# Patient Record
Sex: Female | Born: 1970
Health system: Southern US, Community
[De-identification: ages and names within clinical notes are randomized; demographics above are authoritative.]

## PROBLEM LIST (undated history)

## (undated) DIAGNOSIS — N870 Mild cervical dysplasia: Secondary | ICD-10-CM

## (undated) DIAGNOSIS — D259 Leiomyoma of uterus, unspecified: Secondary | ICD-10-CM

## (undated) DIAGNOSIS — A749 Chlamydial infection, unspecified: Secondary | ICD-10-CM

## (undated) DIAGNOSIS — Z87898 Personal history of other specified conditions: Secondary | ICD-10-CM

## (undated) DIAGNOSIS — Z87440 Personal history of urinary (tract) infections: Secondary | ICD-10-CM

## (undated) DIAGNOSIS — B9689 Other specified bacterial agents as the cause of diseases classified elsewhere: Secondary | ICD-10-CM

## (undated) DIAGNOSIS — D649 Anemia, unspecified: Secondary | ICD-10-CM

## (undated) DIAGNOSIS — B379 Candidiasis, unspecified: Secondary | ICD-10-CM

## (undated) DIAGNOSIS — I10 Essential (primary) hypertension: Secondary | ICD-10-CM

## (undated) DIAGNOSIS — N760 Acute vaginitis: Secondary | ICD-10-CM

## (undated) DIAGNOSIS — Z8742 Personal history of other diseases of the female genital tract: Secondary | ICD-10-CM

## (undated) DIAGNOSIS — B373 Candidiasis of vulva and vagina: Secondary | ICD-10-CM

## (undated) DIAGNOSIS — Z8619 Personal history of other infectious and parasitic diseases: Secondary | ICD-10-CM

## (undated) DIAGNOSIS — M545 Low back pain: Secondary | ICD-10-CM

## (undated) HISTORY — DX: Personal history of other infectious and parasitic diseases: Z86.19

## (undated) HISTORY — DX: Personal history of other specified conditions: Z87.898

## (undated) HISTORY — DX: Candidiasis of vulva and vagina: B37.3

## (undated) HISTORY — DX: Personal history of urinary (tract) infections: Z87.440

## (undated) HISTORY — DX: Other specified bacterial agents as the cause of diseases classified elsewhere: B96.89

## (undated) HISTORY — PX: OTHER SURGICAL HISTORY: SHX169

## (undated) HISTORY — DX: Personal history of other diseases of the female genital tract: Z87.42

## (undated) HISTORY — DX: Chlamydial infection, unspecified: A74.9

## (undated) HISTORY — DX: Acute vaginitis: N76.0

## (undated) HISTORY — DX: Candidiasis, unspecified: B37.9

## (undated) HISTORY — DX: Low back pain: M54.5

## (undated) HISTORY — DX: Leiomyoma of uterus, unspecified: D25.9

## (undated) HISTORY — PX: TONSILLECTOMY: SUR1361

## (undated) HISTORY — DX: Mild cervical dysplasia: N87.0

---

## 1993-01-09 DIAGNOSIS — A749 Chlamydial infection, unspecified: Secondary | ICD-10-CM | POA: Insufficient documentation

## 1993-01-09 HISTORY — DX: Chlamydial infection, unspecified: A74.9

## 1998-05-11 DIAGNOSIS — B3731 Acute candidiasis of vulva and vagina: Secondary | ICD-10-CM

## 1998-05-11 DIAGNOSIS — B373 Candidiasis of vulva and vagina: Secondary | ICD-10-CM

## 1998-05-11 DIAGNOSIS — Z87440 Personal history of urinary (tract) infections: Secondary | ICD-10-CM

## 1998-05-11 HISTORY — DX: Candidiasis of vulva and vagina: B37.3

## 1998-05-11 HISTORY — DX: Acute candidiasis of vulva and vagina: B37.31

## 1998-05-11 HISTORY — DX: Personal history of urinary (tract) infections: Z87.440

## 1999-04-04 ENCOUNTER — Other Ambulatory Visit: Admission: RE | Admit: 1999-04-04 | Discharge: 1999-04-04 | Payer: Self-pay | Admitting: *Deleted

## 1999-12-21 ENCOUNTER — Other Ambulatory Visit: Admission: RE | Admit: 1999-12-21 | Discharge: 1999-12-21 | Payer: Self-pay | Admitting: *Deleted

## 1999-12-21 ENCOUNTER — Encounter (INDEPENDENT_AMBULATORY_CARE_PROVIDER_SITE_OTHER): Payer: Self-pay

## 2000-01-18 ENCOUNTER — Other Ambulatory Visit: Admission: RE | Admit: 2000-01-18 | Discharge: 2000-01-18 | Payer: Self-pay | Admitting: *Deleted

## 2000-01-18 ENCOUNTER — Encounter (INDEPENDENT_AMBULATORY_CARE_PROVIDER_SITE_OTHER): Payer: Self-pay

## 2000-08-14 ENCOUNTER — Other Ambulatory Visit: Admission: RE | Admit: 2000-08-14 | Discharge: 2000-08-14 | Payer: Self-pay | Admitting: Obstetrics and Gynecology

## 2001-09-09 ENCOUNTER — Other Ambulatory Visit: Admission: RE | Admit: 2001-09-09 | Discharge: 2001-09-09 | Payer: Self-pay | Admitting: Obstetrics and Gynecology

## 2002-10-04 ENCOUNTER — Other Ambulatory Visit: Admission: RE | Admit: 2002-10-04 | Discharge: 2002-10-04 | Payer: Self-pay | Admitting: Obstetrics and Gynecology

## 2003-10-14 ENCOUNTER — Other Ambulatory Visit: Admission: RE | Admit: 2003-10-14 | Discharge: 2003-10-14 | Payer: Self-pay | Admitting: Obstetrics and Gynecology

## 2004-05-22 ENCOUNTER — Other Ambulatory Visit: Admission: RE | Admit: 2004-05-22 | Discharge: 2004-05-22 | Payer: Self-pay | Admitting: Obstetrics and Gynecology

## 2004-11-20 ENCOUNTER — Other Ambulatory Visit: Admission: RE | Admit: 2004-11-20 | Discharge: 2004-11-20 | Payer: Self-pay | Admitting: Obstetrics and Gynecology

## 2005-11-20 ENCOUNTER — Other Ambulatory Visit: Admission: RE | Admit: 2005-11-20 | Discharge: 2005-11-20 | Payer: Self-pay | Admitting: Obstetrics and Gynecology

## 2007-07-27 ENCOUNTER — Encounter: Admission: RE | Admit: 2007-07-27 | Discharge: 2007-08-14 | Payer: Self-pay | Admitting: Family Medicine

## 2007-12-10 DIAGNOSIS — M545 Low back pain, unspecified: Secondary | ICD-10-CM | POA: Insufficient documentation

## 2007-12-10 HISTORY — DX: Low back pain, unspecified: M54.50

## 2008-11-11 DIAGNOSIS — D259 Leiomyoma of uterus, unspecified: Secondary | ICD-10-CM

## 2008-11-11 HISTORY — DX: Leiomyoma of uterus, unspecified: D25.9

## 2010-12-06 DIAGNOSIS — Z8742 Personal history of other diseases of the female genital tract: Secondary | ICD-10-CM | POA: Insufficient documentation

## 2010-12-06 DIAGNOSIS — B9689 Other specified bacterial agents as the cause of diseases classified elsewhere: Secondary | ICD-10-CM | POA: Insufficient documentation

## 2010-12-06 DIAGNOSIS — N760 Acute vaginitis: Secondary | ICD-10-CM | POA: Insufficient documentation

## 2010-12-06 HISTORY — DX: Other specified bacterial agents as the cause of diseases classified elsewhere: B96.89

## 2010-12-06 HISTORY — DX: Acute vaginitis: N76.0

## 2010-12-06 HISTORY — DX: Personal history of other diseases of the female genital tract: Z87.42

## 2011-11-12 DIAGNOSIS — Z8742 Personal history of other diseases of the female genital tract: Secondary | ICD-10-CM

## 2011-11-12 HISTORY — DX: Personal history of other diseases of the female genital tract: Z87.42

## 2012-01-02 DIAGNOSIS — Z87898 Personal history of other specified conditions: Secondary | ICD-10-CM | POA: Insufficient documentation

## 2012-01-02 HISTORY — DX: Personal history of other specified conditions: Z87.898

## 2012-01-17 ENCOUNTER — Encounter (INDEPENDENT_AMBULATORY_CARE_PROVIDER_SITE_OTHER): Payer: 59 | Admitting: Obstetrics and Gynecology

## 2012-01-17 ENCOUNTER — Other Ambulatory Visit (INDEPENDENT_AMBULATORY_CARE_PROVIDER_SITE_OTHER): Payer: 59

## 2012-01-17 DIAGNOSIS — N83209 Unspecified ovarian cyst, unspecified side: Secondary | ICD-10-CM

## 2012-01-17 DIAGNOSIS — D259 Leiomyoma of uterus, unspecified: Secondary | ICD-10-CM

## 2012-02-10 DIAGNOSIS — N920 Excessive and frequent menstruation with regular cycle: Secondary | ICD-10-CM | POA: Insufficient documentation

## 2012-02-10 DIAGNOSIS — D219 Benign neoplasm of connective and other soft tissue, unspecified: Secondary | ICD-10-CM | POA: Insufficient documentation

## 2012-02-10 DIAGNOSIS — N83209 Unspecified ovarian cyst, unspecified side: Secondary | ICD-10-CM | POA: Insufficient documentation

## 2012-02-10 DIAGNOSIS — Z8742 Personal history of other diseases of the female genital tract: Secondary | ICD-10-CM | POA: Insufficient documentation

## 2012-02-10 DIAGNOSIS — R350 Frequency of micturition: Secondary | ICD-10-CM | POA: Insufficient documentation

## 2012-02-12 ENCOUNTER — Other Ambulatory Visit: Payer: Self-pay

## 2012-02-12 DIAGNOSIS — N83209 Unspecified ovarian cyst, unspecified side: Secondary | ICD-10-CM

## 2012-02-28 ENCOUNTER — Encounter: Payer: 59 | Admitting: Obstetrics and Gynecology

## 2012-02-28 ENCOUNTER — Other Ambulatory Visit: Payer: 59

## 2012-03-09 ENCOUNTER — Encounter: Payer: Self-pay | Admitting: Obstetrics and Gynecology

## 2012-03-09 ENCOUNTER — Ambulatory Visit (INDEPENDENT_AMBULATORY_CARE_PROVIDER_SITE_OTHER): Payer: 59

## 2012-03-09 ENCOUNTER — Other Ambulatory Visit: Payer: Self-pay | Admitting: Obstetrics and Gynecology

## 2012-03-09 ENCOUNTER — Ambulatory Visit (INDEPENDENT_AMBULATORY_CARE_PROVIDER_SITE_OTHER): Payer: 59 | Admitting: Obstetrics and Gynecology

## 2012-03-09 VITALS — BP 120/90 | Temp 99.0°F | Wt 221.0 lb

## 2012-03-09 DIAGNOSIS — N83209 Unspecified ovarian cyst, unspecified side: Secondary | ICD-10-CM

## 2012-03-09 DIAGNOSIS — M545 Low back pain, unspecified: Secondary | ICD-10-CM

## 2012-03-09 DIAGNOSIS — B49 Unspecified mycosis: Secondary | ICD-10-CM

## 2012-03-09 DIAGNOSIS — B9689 Other specified bacterial agents as the cause of diseases classified elsewhere: Secondary | ICD-10-CM

## 2012-03-09 DIAGNOSIS — A499 Bacterial infection, unspecified: Secondary | ICD-10-CM

## 2012-03-09 DIAGNOSIS — D259 Leiomyoma of uterus, unspecified: Secondary | ICD-10-CM

## 2012-03-09 DIAGNOSIS — Z87440 Personal history of urinary (tract) infections: Secondary | ICD-10-CM

## 2012-03-09 DIAGNOSIS — A749 Chlamydial infection, unspecified: Secondary | ICD-10-CM

## 2012-03-09 DIAGNOSIS — A7489 Other chlamydial diseases: Secondary | ICD-10-CM

## 2012-03-09 DIAGNOSIS — Z8619 Personal history of other infectious and parasitic diseases: Secondary | ICD-10-CM | POA: Insufficient documentation

## 2012-03-09 DIAGNOSIS — Z87898 Personal history of other specified conditions: Secondary | ICD-10-CM

## 2012-03-09 DIAGNOSIS — N760 Acute vaginitis: Secondary | ICD-10-CM

## 2012-03-09 DIAGNOSIS — Z87448 Personal history of other diseases of urinary system: Secondary | ICD-10-CM

## 2012-03-09 DIAGNOSIS — Z8742 Personal history of other diseases of the female genital tract: Secondary | ICD-10-CM

## 2012-03-09 DIAGNOSIS — N870 Mild cervical dysplasia: Secondary | ICD-10-CM | POA: Insufficient documentation

## 2012-03-09 DIAGNOSIS — B379 Candidiasis, unspecified: Secondary | ICD-10-CM

## 2012-03-09 DIAGNOSIS — N926 Irregular menstruation, unspecified: Secondary | ICD-10-CM

## 2012-03-09 NOTE — Progress Notes (Signed)
Addended byWinfred Leeds on: 03/09/2012 01:07 PM   Modules accepted: Orders

## 2012-03-09 NOTE — Patient Instructions (Signed)
Give patient a note for work. 

## 2012-03-09 NOTE — Progress Notes (Signed)
Patient seen 01/18/12 with right ovarian cyst 3.9 cm returns for f/u ultrasound.  Patient had two episodes of  bleeding in March but LMP was normal. (flow 5-7 day with pad change hourly-2 hours)   O: Right  ovarian cyst resolved, fibroids unchanged  UPT: negative  A:  Resolved right ovarian cyst  P: Menstrual calendar     Annual exam in 1 year 12/2012

## 2012-06-22 ENCOUNTER — Telehealth: Payer: Self-pay | Admitting: Obstetrics and Gynecology

## 2012-06-22 NOTE — Telephone Encounter (Signed)
Triage/gen.quest °

## 2012-06-22 NOTE — Telephone Encounter (Signed)
Spoke with pt Alice Flores msg pt going on a trip in October wants rx to delay cycle pt states was given something once before advised pt will consult with provider and call her back pt voice understanding

## 2012-06-25 ENCOUNTER — Telehealth: Payer: Self-pay | Admitting: Obstetrics and Gynecology

## 2012-06-25 NOTE — Telephone Encounter (Signed)
LINDER/EP PT

## 2012-06-25 NOTE — Telephone Encounter (Signed)
Call to patien, t with a history of menorrhagia and fibroids, to ask about the specific medication she was requesting.  After a review of her chart, she was given, in January of 2012 Provera 10 mg to take to decrease her flow while on vacation however, no other regimen relating to altering her period was seen in her chart. Left voicemail message for patient stating this and asked her to return call with more details.  If patient is referring to the Provera, she may be given Provera 10 mg # 30 1 po every 6 hours until her bleeding stops then 1 po daily x 14 days no refill , for her to take for heavy bleeding if needed, while on her trip.    Braylyn Eye, PA-C

## 2012-06-25 NOTE — Telephone Encounter (Signed)
Patient requesting medication to delay her menses because of a trip she will be going on in October.  States that she has been given "something" before and would like that same medication.   Patient's chart is needed to further manage this patient.  Amilyah Nack, PA-C

## 2012-06-25 NOTE — Telephone Encounter (Signed)
Chart on your desk.

## 2012-06-26 ENCOUNTER — Telehealth: Payer: Self-pay

## 2012-06-26 ENCOUNTER — Other Ambulatory Visit: Payer: Self-pay

## 2012-06-26 MED ORDER — MEDROXYPROGESTERONE ACETATE 10 MG PO TABS: ORAL_TABLET | ORAL | Status: DC

## 2012-06-26 NOTE — Telephone Encounter (Signed)
TC TO PT REGARDING RX REQUEST. INFORMED PT THAT PER EP WE WILL SEND TO PT PHARMACY PROVERA 10 MG #30 1PO EVERY 6 HOURS UNTIL BLEEDING STOPS THEN 1PO DAILY X 14 DAYS WITH NO REFILLS. PT VOICED UNDERSTANDING.

## 2012-06-26 NOTE — Telephone Encounter (Signed)
done

## 2013-01-27 ENCOUNTER — Other Ambulatory Visit: Payer: Self-pay | Admitting: Obstetrics and Gynecology

## 2013-01-27 ENCOUNTER — Other Ambulatory Visit: Payer: Self-pay

## 2013-01-27 DIAGNOSIS — Z1231 Encounter for screening mammogram for malignant neoplasm of breast: Secondary | ICD-10-CM

## 2013-01-28 LAB — HIV ANTIBODY (ROUTINE TESTING W REFLEX): HIV: NONREACTIVE

## 2013-01-28 LAB — HSV(HERPES SIMPLEX VRS) I + II AB-IGG: HSV 2 Glycoprotein G Ab, IgG: 0.12 IV

## 2013-01-29 LAB — PAP IG, CT-NG, RFX HPV ASCU

## 2013-02-22 ENCOUNTER — Ambulatory Visit
Admission: RE | Admit: 2013-02-22 | Discharge: 2013-02-22 | Disposition: A | Discharge status: Home / Self Care | Payer: 59 | Source: Ambulatory Visit

## 2013-02-22 DIAGNOSIS — Z1231 Encounter for screening mammogram for malignant neoplasm of breast: Secondary | ICD-10-CM

## 2013-02-23 ENCOUNTER — Other Ambulatory Visit: Payer: Self-pay

## 2013-02-23 DIAGNOSIS — R928 Other abnormal and inconclusive findings on diagnostic imaging of breast: Secondary | ICD-10-CM

## 2013-03-09 ENCOUNTER — Ambulatory Visit
Admission: RE | Admit: 2013-03-09 | Discharge: 2013-03-09 | Disposition: A | Discharge status: Home / Self Care | Payer: 59 | Source: Ambulatory Visit

## 2013-03-09 DIAGNOSIS — R928 Other abnormal and inconclusive findings on diagnostic imaging of breast: Secondary | ICD-10-CM

## 2014-02-03 ENCOUNTER — Other Ambulatory Visit: Payer: Self-pay

## 2014-02-03 DIAGNOSIS — Z1231 Encounter for screening mammogram for malignant neoplasm of breast: Secondary | ICD-10-CM

## 2014-02-23 ENCOUNTER — Ambulatory Visit: Payer: 59

## 2014-02-25 ENCOUNTER — Ambulatory Visit
Admission: RE | Admit: 2014-02-25 | Discharge: 2014-02-25 | Disposition: A | Discharge status: Home / Self Care | Payer: 59 | Source: Ambulatory Visit

## 2014-02-25 DIAGNOSIS — Z1231 Encounter for screening mammogram for malignant neoplasm of breast: Secondary | ICD-10-CM

## 2015-01-26 ENCOUNTER — Other Ambulatory Visit: Payer: Self-pay

## 2015-01-26 DIAGNOSIS — Z1239 Encounter for other screening for malignant neoplasm of breast: Secondary | ICD-10-CM

## 2015-03-03 ENCOUNTER — Ambulatory Visit
Admission: RE | Admit: 2015-03-03 | Discharge: 2015-03-03 | Disposition: A | Discharge status: Home / Self Care | Payer: 59 | Source: Ambulatory Visit

## 2015-03-03 DIAGNOSIS — Z1239 Encounter for other screening for malignant neoplasm of breast: Secondary | ICD-10-CM

## 2016-02-02 ENCOUNTER — Other Ambulatory Visit: Payer: Self-pay

## 2016-02-02 DIAGNOSIS — Z1231 Encounter for screening mammogram for malignant neoplasm of breast: Secondary | ICD-10-CM

## 2016-03-04 ENCOUNTER — Ambulatory Visit
Admission: RE | Admit: 2016-03-04 | Discharge: 2016-03-04 | Disposition: A | Discharge status: Home / Self Care | Payer: 59 | Source: Ambulatory Visit

## 2016-03-04 DIAGNOSIS — Z1231 Encounter for screening mammogram for malignant neoplasm of breast: Secondary | ICD-10-CM

## 2016-03-06 ENCOUNTER — Other Ambulatory Visit: Payer: Self-pay | Admitting: Obstetrics and Gynecology

## 2016-03-06 DIAGNOSIS — R928 Other abnormal and inconclusive findings on diagnostic imaging of breast: Secondary | ICD-10-CM

## 2016-03-13 ENCOUNTER — Ambulatory Visit
Admission: RE | Admit: 2016-03-13 | Discharge: 2016-03-13 | Disposition: A | Discharge status: Home / Self Care | Payer: 59 | Source: Ambulatory Visit | Attending: Obstetrics and Gynecology | Admitting: Obstetrics and Gynecology

## 2016-03-13 DIAGNOSIS — R928 Other abnormal and inconclusive findings on diagnostic imaging of breast: Secondary | ICD-10-CM

## 2017-02-13 ENCOUNTER — Other Ambulatory Visit: Payer: Self-pay | Admitting: Obstetrics and Gynecology

## 2017-02-13 DIAGNOSIS — Z1231 Encounter for screening mammogram for malignant neoplasm of breast: Secondary | ICD-10-CM

## 2017-02-17 DIAGNOSIS — Z0000 Encounter for general adult medical examination without abnormal findings: Secondary | ICD-10-CM | POA: Diagnosis not present

## 2017-02-21 DIAGNOSIS — I10 Essential (primary) hypertension: Secondary | ICD-10-CM | POA: Diagnosis not present

## 2017-02-21 DIAGNOSIS — R7301 Impaired fasting glucose: Secondary | ICD-10-CM | POA: Diagnosis not present

## 2017-02-21 DIAGNOSIS — D649 Anemia, unspecified: Secondary | ICD-10-CM | POA: Diagnosis not present

## 2017-02-21 DIAGNOSIS — Z0000 Encounter for general adult medical examination without abnormal findings: Secondary | ICD-10-CM | POA: Diagnosis not present

## 2017-02-21 DIAGNOSIS — Z13220 Encounter for screening for lipoid disorders: Secondary | ICD-10-CM | POA: Diagnosis not present

## 2017-03-05 ENCOUNTER — Ambulatory Visit
Admission: RE | Admit: 2017-03-05 | Discharge: 2017-03-05 | Disposition: A | Discharge status: Home / Self Care | Payer: 59 | Source: Ambulatory Visit | Attending: Obstetrics and Gynecology | Admitting: Obstetrics and Gynecology

## 2017-03-05 DIAGNOSIS — Z1231 Encounter for screening mammogram for malignant neoplasm of breast: Secondary | ICD-10-CM

## 2017-03-26 DIAGNOSIS — N920 Excessive and frequent menstruation with regular cycle: Secondary | ICD-10-CM | POA: Diagnosis not present

## 2017-03-26 DIAGNOSIS — Z01419 Encounter for gynecological examination (general) (routine) without abnormal findings: Secondary | ICD-10-CM | POA: Diagnosis not present

## 2017-03-28 DIAGNOSIS — D649 Anemia, unspecified: Secondary | ICD-10-CM | POA: Diagnosis not present

## 2017-04-01 DIAGNOSIS — N920 Excessive and frequent menstruation with regular cycle: Secondary | ICD-10-CM | POA: Diagnosis not present

## 2017-04-01 DIAGNOSIS — N946 Dysmenorrhea, unspecified: Secondary | ICD-10-CM | POA: Diagnosis not present

## 2017-04-01 DIAGNOSIS — D649 Anemia, unspecified: Secondary | ICD-10-CM | POA: Diagnosis not present

## 2017-04-11 ENCOUNTER — Other Ambulatory Visit: Payer: Self-pay | Admitting: Obstetrics and Gynecology

## 2017-04-11 DIAGNOSIS — D250 Submucous leiomyoma of uterus: Secondary | ICD-10-CM

## 2017-04-18 ENCOUNTER — Other Ambulatory Visit: Payer: Self-pay | Admitting: Internal Medicine

## 2017-04-22 ENCOUNTER — Other Ambulatory Visit (HOSPITAL_COMMUNITY): Payer: Self-pay | Admitting: Diagnostic Radiology

## 2017-04-22 ENCOUNTER — Other Ambulatory Visit: Payer: Self-pay | Admitting: *Deleted

## 2017-04-22 ENCOUNTER — Ambulatory Visit
Admission: RE | Admit: 2017-04-22 | Discharge: 2017-04-22 | Disposition: A | Discharge status: Home / Self Care | Payer: 59 | Source: Ambulatory Visit | Attending: Obstetrics and Gynecology | Admitting: Obstetrics and Gynecology

## 2017-04-22 DIAGNOSIS — D250 Submucous leiomyoma of uterus: Secondary | ICD-10-CM

## 2017-04-22 DIAGNOSIS — D259 Leiomyoma of uterus, unspecified: Secondary | ICD-10-CM

## 2017-04-22 DIAGNOSIS — N920 Excessive and frequent menstruation with regular cycle: Secondary | ICD-10-CM | POA: Diagnosis not present

## 2017-04-22 DIAGNOSIS — N946 Dysmenorrhea, unspecified: Secondary | ICD-10-CM | POA: Diagnosis not present

## 2017-04-22 HISTORY — PX: IR RADIOLOGIST EVAL & MGMT: IMG5224

## 2017-04-22 HISTORY — DX: Essential (primary) hypertension: I10

## 2017-04-22 NOTE — Consult Note (Signed)
Chief Complaint: Patient was seen in consultation today for  Chief Complaint  Patient presents with  . Advice Only    Consult for [place]    at the request of Golden  Referring Physician(s): Haygood,Vanessa P  History of Present Illness: Alice Flores is a 46 y.o. female with menorrhagia and dysmenorrhea. Ultrasound demonstrates multiple uterine fibroids. The patient was diagnosed with anemia and has been treated with iron therapy. Patient is feeling better since she's been on iron treatment. Patient has never been pregnant and has no desire to have children. The menstrual bleeding occurs every month and the bleeding lasts 7-8 days. She has 2-3 days of heavy bleeding and using both tampons and pads. The heavy bleeding times are very lifestyle limiting. She has to time when she leaves the house so that she can get to a bathroom in time during the heavy bleeding days. Patient used Lysteda and the periods were lighter but longer.  She has low pelvic pain during her menstrual bleeding. Recently. she has developed focal tenderness along the lateral right upper thigh. She thinks that this is musculoskeletal but feels that he could be associated with her fibroid disease. Reportedly, hemoglobin was 8.5 in April and now it is 11.3. She is not on hormonal therapy. Pap smear on 03/26/2017 was negative.   Past Medical History:  Diagnosis Date  . Bacterial vaginosis 12/06/10  . Candida vaginitis 05/1998  . Chlamydia 01/1993  . Dysplasia of cervix, low grade (CIN 1)    with negative ECC  . H/O dysmenorrhea 2013  . H/O fatigue   . H/O menorrhagia 12/06/10  . History of HPV infection   . History of UTI 05/1998  . Hx of urinary frequency 01/02/12  . Hypertension   . Lower back pain 12/10/2007  . Uterine fibroid 2010  . Yeast infection     Past Surgical History:  Procedure Laterality Date  . TONSILLECTOMY      Allergies: Naprosyn [naproxen]  Medications: Prior to Admission  medications   Medication Sig Start Date End Date Taking? Authorizing Provider  amLODipine (NORVASC) 10 MG tablet Take 10 mg by mouth daily.   Yes [provider]  BORIC ACID EX Apply topically.   Yes [provider]  Boric Acid POWD by Does not apply route.   Yes [provider]  hydrochlorothiazide (HYDRODIURIL) 25 MG tablet Take 25 mg by mouth daily.   Yes [provider]  HYDROcodone-acetaminophen (VICODIN) 2.5-500 MG per tablet Take 1 tablet by mouth every 6 (six) hours as needed.   Yes [provider]  medroxyPROGESTERone (PROVERA) 10 MG tablet ONE BY MOUTH EVERY 6 HOURS UNTIL BLEEDING STOPS;THEN 1 BY MOUTH DAILY FOR 14 DAYS Patient not taking: Reported on 04/22/2017 06/26/12   Earnstine Regal, PA-C  naproxen (NAPROSYN) 500 MG tablet Take 500 mg by mouth 2 (two) times daily with a meal.    [provider]     Family History  Problem Relation Age of Onset  . Diabetes Maternal Grandmother   . Heart disease Maternal Grandmother   . Diabetes Mother   . Hypertension Mother   . Diabetes Sister     Social History   Social History  . Marital status: Single    Spouse name: N/A  . Number of children: N/A  . Years of education: N/A   Social History Main Topics  . Smoking status: Former Research scientist (life sciences)  . Smokeless tobacco: Not on file     Comment: long  time ago  . Alcohol use Yes     Comment: socially  . Drug use: No  . Sexual activity: Yes    Partners: Male    Birth control/ protection: Abstinence   Other Topics Concern  . Not on file   Social History Narrative  . No narrative on file    Review of Systems: A 12 point ROS discussed and pertinent positives are indicated in the HPI above.  All other systems are negative.  Review of Systems  Constitutional: Negative for activity change.  Respiratory: Positive for shortness of breath.   Cardiovascular: Negative.   Gastrointestinal: Negative.   Genitourinary: Positive for menstrual  problem and vaginal bleeding.  Musculoskeletal: Positive for back pain.    Vital Signs: BP (!) 144/78   Pulse 82   Temp 98.2 F (36.8 C) (Oral)   Resp 14   Ht 5' 1.5" (1.562 m)   Wt 218 lb (98.9 kg)   SpO2 100%   BMI 40.52 kg/m   Physical Exam  Constitutional:  Obese black female. No distress.  Cardiovascular: Normal rate, regular rhythm, normal heart sounds and intact distal pulses.   Pulmonary/Chest: Effort normal and breath sounds normal.  Abdominal: Soft. Bowel sounds are normal. She exhibits no distension.  Musculoskeletal: She exhibits tenderness. She exhibits no edema.  Tenderness in the lateral right upper thigh.    Imaging: Reviewed ultrasound images from [facility] dated 04/01/2017.  Evidence for multiple fibroids. Endometrium not well visualized. Limited evaluation of the ovaries.   Labs:  CBC: No results for input(s): WBC, HGB, HCT, PLT in the last 8760 hours.  COAGS: No results for input(s): INR, APTT in the last 8760 hours.  BMP: No results for input(s): NA, K, CL, CO2, GLUCOSE, BUN, CALCIUM, CREATININE, GFRNONAA, GFRAA in the last 8760 hours.  Invalid input(s): CMP  LIVER FUNCTION TESTS: No results for input(s): BILITOT, AST, ALT, ALKPHOS, PROT, ALBUMIN in the last 8760 hours.  TUMOR MARKERS: No results for input(s): AFPTM, CEA, CA199, CHROMGRNA in the last 8760 hours.  Assessment and Plan:  46 year old with menorrhagia and dysmenorrhea. Patient has been diagnosed with uterine fibroids based on ultrasound. Patient has been educated about the different treatment options including hormonal therapy, surgery, endometrial ablation and uterine artery embolization. Patient is most interested in the uterine artery embolization at this time. We discussed the uterine artery ambulation procedure in depth. Explained that we could get arterial access from either the left wrist or the groin. We discussed the use of moderate sedation and the need for  overnight observation for symptom management. We discussed post-embolic syndrome and the fact that she will most likely be out of work for 2 weeks. We discussed the risks including vascular injury, bleeding and infection. Patient has a good understanding of the procedure and she would like to pursue uterine artery embolization procedure. We discussed the need for an endometrial biopsy and pelvic MRI. If there are no contraindications based on MRI, the patient would be a good candidate for uterine artery embolization procedure. Due to patient's weight and stature, she may tolerate the procedure best from a left radial access rather than a femoral access.  Plan for endometrial biopsy and MRI of the pelvis. If no contraindications based on these tests, plan for uterine artery embolization at the patient's convenience.  Thank you for this interesting consult.  I greatly enjoyed meeting Cristol Engdahl and look forward to participating in their care.  A copy of this report was sent to  the requesting provider on this date.  Electronically Signed: Carylon Perches 04/22/2017, 3:42 PM   I spent a total of  30 Minutes   in face to face in clinical consultation, greater than 50% of which was counseling/coordinating care for uterine fibroids and menorrhagia.

## 2017-04-28 ENCOUNTER — Encounter: Payer: Self-pay | Admitting: Diagnostic Radiology

## 2017-04-29 ENCOUNTER — Ambulatory Visit (HOSPITAL_COMMUNITY)
Admission: RE | Admit: 2017-04-29 | Discharge: 2017-04-29 | Disposition: A | Discharge status: Home / Self Care | Payer: 59 | Source: Ambulatory Visit | Attending: Diagnostic Radiology | Admitting: Diagnostic Radiology

## 2017-04-29 DIAGNOSIS — D259 Leiomyoma of uterus, unspecified: Secondary | ICD-10-CM

## 2017-04-29 DIAGNOSIS — D250 Submucous leiomyoma of uterus: Secondary | ICD-10-CM | POA: Insufficient documentation

## 2017-04-29 LAB — POCT I-STAT CREATININE: CREATININE: 0.9 mg/dL (ref 0.44–1.00)

## 2017-04-29 MED ORDER — GADOBENATE DIMEGLUMINE 529 MG/ML IV SOLN
20.0000 mL | Freq: Once | INTRAVENOUS | Status: AC | PRN
  Administered 2017-04-29: 20 mL via INTRAVENOUS

## 2017-05-16 DIAGNOSIS — N946 Dysmenorrhea, unspecified: Secondary | ICD-10-CM | POA: Diagnosis not present

## 2017-05-16 DIAGNOSIS — N920 Excessive and frequent menstruation with regular cycle: Secondary | ICD-10-CM | POA: Diagnosis not present

## 2017-05-27 ENCOUNTER — Other Ambulatory Visit (HOSPITAL_COMMUNITY): Payer: Self-pay | Admitting: Diagnostic Radiology

## 2017-05-27 DIAGNOSIS — D259 Leiomyoma of uterus, unspecified: Secondary | ICD-10-CM

## 2017-07-09 ENCOUNTER — Other Ambulatory Visit: Payer: Self-pay | Admitting: Radiology

## 2017-07-10 ENCOUNTER — Encounter (HOSPITAL_COMMUNITY): Payer: Self-pay

## 2017-07-10 ENCOUNTER — Observation Stay (HOSPITAL_COMMUNITY)
Admission: RE | Admit: 2017-07-10 | Discharge: 2017-07-11 | Disposition: A | Discharge status: Home / Self Care | Payer: 59 | Source: Ambulatory Visit | Attending: Diagnostic Radiology | Admitting: Diagnostic Radiology

## 2017-07-10 ENCOUNTER — Ambulatory Visit (HOSPITAL_COMMUNITY)
Admission: RE | Admit: 2017-07-10 | Discharge: 2017-07-10 | Disposition: A | Discharge status: Home / Self Care | Payer: 59 | Source: Ambulatory Visit | Attending: Diagnostic Radiology | Admitting: Diagnostic Radiology

## 2017-07-10 DIAGNOSIS — Z87891 Personal history of nicotine dependence: Secondary | ICD-10-CM | POA: Insufficient documentation

## 2017-07-10 DIAGNOSIS — I10 Essential (primary) hypertension: Secondary | ICD-10-CM

## 2017-07-10 DIAGNOSIS — N920 Excessive and frequent menstruation with regular cycle: Secondary | ICD-10-CM | POA: Diagnosis not present

## 2017-07-10 DIAGNOSIS — D259 Leiomyoma of uterus, unspecified: Secondary | ICD-10-CM | POA: Diagnosis present

## 2017-07-10 HISTORY — PX: IR US GUIDE VASC ACCESS RIGHT: IMG2390

## 2017-07-10 HISTORY — DX: Anemia, unspecified: D64.9

## 2017-07-10 HISTORY — PX: IR EMBO TUMOR ORGAN ISCHEMIA INFARCT INC GUIDE ROADMAPPING: IMG5449

## 2017-07-10 HISTORY — PX: IR ANGIOGRAM SELECTIVE EACH ADDITIONAL VESSEL: IMG667

## 2017-07-10 HISTORY — PX: IR ANGIOGRAM PELVIS SELECTIVE OR SUPRASELECTIVE: IMG661

## 2017-07-10 LAB — CBC
HCT: 34.9 % — ABNORMAL LOW (ref 36.0–46.0)
HEMOGLOBIN: 11.6 g/dL — AB (ref 12.0–15.0)
MCH: 27 pg (ref 26.0–34.0)
MCHC: 33.2 g/dL (ref 30.0–36.0)
MCV: 81.2 fL (ref 78.0–100.0)
PLATELETS: 240 10*3/uL (ref 150–400)
RBC: 4.3 MIL/uL (ref 3.87–5.11)
RDW: 14 % (ref 11.5–15.5)
WBC: 6.3 10*3/uL (ref 4.0–10.5)

## 2017-07-10 LAB — BASIC METABOLIC PANEL
Anion gap: 8 (ref 5–15)
BUN: 11 mg/dL (ref 6–20)
CALCIUM: 8.8 mg/dL — AB (ref 8.9–10.3)
CHLORIDE: 103 mmol/L (ref 101–111)
CO2: 29 mmol/L (ref 22–32)
CREATININE: 0.95 mg/dL (ref 0.44–1.00)
GFR calc Af Amer: >60 mL/min (ref >60)
GFR calc non Af Amer: >60 mL/min (ref >60)
GLUCOSE: 104 mg/dL — AB (ref 65–99)
Potassium: 3.7 mmol/L (ref 3.5–5.1)
Sodium: 140 mmol/L (ref 135–145)

## 2017-07-10 LAB — HCG, SERUM, QUALITATIVE: PREG SERUM: NEGATIVE

## 2017-07-10 LAB — APTT: aPTT: 33 seconds (ref 24–36)

## 2017-07-10 LAB — PROTIME-INR
INR: 1.02
PROTHROMBIN TIME: 13.3 s (ref 11.4–15.2)

## 2017-07-10 MED ORDER — METOPROLOL TARTRATE 5 MG/5ML IV SOLN
INTRAVENOUS | Status: AC
  Filled 2017-07-10: qty 5

## 2017-07-10 MED ORDER — IOPAMIDOL (ISOVUE-300) INJECTION 61%: 100.0000 mL | Freq: Once | INTRAVENOUS | Status: DC | PRN

## 2017-07-10 MED ORDER — CEFAZOLIN SODIUM-DEXTROSE 2-4 GM/100ML-% IV SOLN
INTRAVENOUS | Status: AC
  Filled 2017-07-10: qty 100

## 2017-07-10 MED ORDER — FERROUS SULFATE 325 (65 FE) MG PO TABS
325.0000 mg | ORAL_TABLET | Freq: Every day | ORAL | Status: DC
  Administered 2017-07-11: 325 mg via ORAL
  Filled 2017-07-10: qty 1

## 2017-07-10 MED ORDER — ONDANSETRON HCL 4 MG/2ML IJ SOLN
4.0000 mg | Freq: Once | INTRAMUSCULAR | Status: AC
  Administered 2017-07-10: 4 mg via INTRAVENOUS

## 2017-07-10 MED ORDER — FENTANYL CITRATE (PF) 100 MCG/2ML IJ SOLN
INTRAMUSCULAR | Status: AC | PRN
  Administered 2017-07-10 (×2): 50 ug via INTRAVENOUS
  Administered 2017-07-10 (×4): 25 ug via INTRAVENOUS
  Administered 2017-07-10: 50 ug via INTRAVENOUS

## 2017-07-10 MED ORDER — MIDAZOLAM HCL 2 MG/2ML IJ SOLN
INTRAMUSCULAR | Status: AC | PRN
  Administered 2017-07-10: 1 mg via INTRAVENOUS

## 2017-07-10 MED ORDER — NALOXONE HCL 0.4 MG/ML IJ SOLN: 0.4000 mg | INTRAMUSCULAR | Status: DC | PRN

## 2017-07-10 MED ORDER — LIDOCAINE HCL (PF) 1 % IJ SOLN
INTRAMUSCULAR | Status: AC
  Filled 2017-07-10: qty 30

## 2017-07-10 MED ORDER — SODIUM CHLORIDE 0.9% FLUSH
3.0000 mL | Freq: Two times a day (BID) | INTRAVENOUS | Status: DC
  Administered 2017-07-11: 3 mL via INTRAVENOUS

## 2017-07-10 MED ORDER — FENTANYL CITRATE (PF) 100 MCG/2ML IJ SOLN
INTRAMUSCULAR | Status: AC
  Filled 2017-07-10: qty 8

## 2017-07-10 MED ORDER — ONDANSETRON HCL 4 MG/2ML IJ SOLN
4.0000 mg | Freq: Four times a day (QID) | INTRAMUSCULAR | Status: DC | PRN
  Administered 2017-07-11: 4 mg via INTRAVENOUS
  Filled 2017-07-10: qty 2

## 2017-07-10 MED ORDER — CEFAZOLIN SODIUM-DEXTROSE 2-4 GM/100ML-% IV SOLN
2.0000 g | INTRAVENOUS | Status: AC
  Administered 2017-07-10: 2 g via INTRAVENOUS

## 2017-07-10 MED ORDER — HYDROCHLOROTHIAZIDE 25 MG PO TABS
25.0000 mg | ORAL_TABLET | Freq: Every day | ORAL | Status: DC
  Administered 2017-07-11: 25 mg via ORAL
  Filled 2017-07-10: qty 1

## 2017-07-10 MED ORDER — KETOROLAC TROMETHAMINE 30 MG/ML IJ SOLN
30.0000 mg | Freq: Four times a day (QID) | INTRAMUSCULAR | Status: DC
  Administered 2017-07-10 – 2017-07-11 (×4): 30 mg via INTRAVENOUS
  Filled 2017-07-10 (×4): qty 1

## 2017-07-10 MED ORDER — MIDAZOLAM HCL 2 MG/2ML IJ SOLN
INTRAMUSCULAR | Status: AC
  Filled 2017-07-10: qty 8

## 2017-07-10 MED ORDER — LIDOCAINE HCL (PF) 1 % IJ SOLN
INTRAMUSCULAR | Status: AC | PRN
  Administered 2017-07-10: 10 mL via INTRADERMAL

## 2017-07-10 MED ORDER — IOPAMIDOL (ISOVUE-300) INJECTION 61%
INTRAVENOUS | Status: AC
  Filled 2017-07-10: qty 100

## 2017-07-10 MED ORDER — PROMETHAZINE HCL 25 MG RE SUPP
25.0000 mg | Freq: Three times a day (TID) | RECTAL | Status: DC | PRN
  Filled 2017-07-10: qty 1

## 2017-07-10 MED ORDER — IOPAMIDOL (ISOVUE-300) INJECTION 61%
75.0000 mL | Freq: Once | INTRAVENOUS | Status: AC | PRN
  Administered 2017-07-10: 75 mL via INTRA_ARTERIAL

## 2017-07-10 MED ORDER — HYDRALAZINE HCL 20 MG/ML IJ SOLN
INTRAMUSCULAR | Status: AC | PRN
  Administered 2017-07-10: 10 mg via INTRAVENOUS

## 2017-07-10 MED ORDER — ENSURE ENLIVE PO LIQD
237.0000 mL | Freq: Two times a day (BID) | ORAL | Status: DC
  Administered 2017-07-11: 237 mL via ORAL

## 2017-07-10 MED ORDER — SODIUM CHLORIDE 0.9 % IV SOLN
INTRAVENOUS | Status: DC
  Administered 2017-07-10: 13:00:00 via INTRAVENOUS

## 2017-07-10 MED ORDER — PROMETHAZINE HCL 25 MG PO TABS: 25.0000 mg | ORAL_TABLET | Freq: Three times a day (TID) | ORAL | Status: DC | PRN

## 2017-07-10 MED ORDER — KETOROLAC TROMETHAMINE 30 MG/ML IJ SOLN
INTRAMUSCULAR | Status: AC
  Filled 2017-07-10: qty 1

## 2017-07-10 MED ORDER — DIPHENHYDRAMINE HCL 12.5 MG/5ML PO ELIX: 12.5000 mg | ORAL_SOLUTION | Freq: Four times a day (QID) | ORAL | Status: DC | PRN

## 2017-07-10 MED ORDER — DIPHENHYDRAMINE HCL 50 MG/ML IJ SOLN: 12.5000 mg | Freq: Four times a day (QID) | INTRAMUSCULAR | Status: DC | PRN

## 2017-07-10 MED ORDER — NITROGLYCERIN IN D5W 100-5 MCG/ML-% IV SOLN
INTRAVENOUS | Status: AC
  Filled 2017-07-10: qty 250

## 2017-07-10 MED ORDER — SODIUM CHLORIDE 0.9% FLUSH: 9.0000 mL | INTRAVENOUS | Status: DC | PRN

## 2017-07-10 MED ORDER — HYDRALAZINE HCL 20 MG/ML IJ SOLN
10.0000 mg | Freq: Four times a day (QID) | INTRAMUSCULAR | Status: DC | PRN
  Administered 2017-07-10: 10 mg via INTRAVENOUS
  Filled 2017-07-10: qty 0.5

## 2017-07-10 MED ORDER — ONDANSETRON HCL 4 MG/2ML IJ SOLN: 4.0000 mg | Freq: Four times a day (QID) | INTRAMUSCULAR | Status: DC | PRN

## 2017-07-10 MED ORDER — KETOROLAC TROMETHAMINE 30 MG/ML IJ SOLN
30.0000 mg | Freq: Once | INTRAMUSCULAR | Status: AC
  Administered 2017-07-10: 30 mg via INTRAVENOUS

## 2017-07-10 MED ORDER — HYDRALAZINE HCL 20 MG/ML IJ SOLN
INTRAMUSCULAR | Status: AC
  Filled 2017-07-10: qty 1

## 2017-07-10 MED ORDER — AMLODIPINE BESYLATE 10 MG PO TABS
10.0000 mg | ORAL_TABLET | Freq: Every day | ORAL | Status: DC
  Administered 2017-07-11: 10 mg via ORAL
  Filled 2017-07-10: qty 1

## 2017-07-10 MED ORDER — SODIUM CHLORIDE 0.9 % IV SOLN
250.0000 mL | INTRAVENOUS | Status: DC | PRN
  Administered 2017-07-10: 250 mL via INTRAVENOUS

## 2017-07-10 MED ORDER — ONDANSETRON HCL 4 MG/2ML IJ SOLN
INTRAMUSCULAR | Status: AC
  Filled 2017-07-10: qty 2

## 2017-07-10 MED ORDER — DOCUSATE SODIUM 100 MG PO CAPS
100.0000 mg | ORAL_CAPSULE | Freq: Two times a day (BID) | ORAL | Status: DC
  Administered 2017-07-10 – 2017-07-11 (×2): 100 mg via ORAL
  Filled 2017-07-10 (×2): qty 1

## 2017-07-10 MED ORDER — METOPROLOL TARTRATE 5 MG/5ML IV SOLN
INTRAVENOUS | Status: AC | PRN
  Administered 2017-07-10: 5 mg via INTRAVENOUS

## 2017-07-10 MED ORDER — SODIUM CHLORIDE 0.9% FLUSH: 3.0000 mL | INTRAVENOUS | Status: DC | PRN

## 2017-07-10 MED ORDER — HYDROMORPHONE 1 MG/ML IV SOLN
INTRAVENOUS | Status: DC
  Administered 2017-07-10: 2.1 mg via INTRAVENOUS
  Administered 2017-07-10: 3.8 mg via INTRAVENOUS
  Administered 2017-07-10: 16:00:00 via INTRAVENOUS
  Administered 2017-07-10: 0.5 mg via INTRAVENOUS
  Administered 2017-07-11: 0.9 mg via INTRAVENOUS
  Administered 2017-07-11: 2.1 mg via INTRAVENOUS
  Filled 2017-07-10: qty 25

## 2017-07-10 NOTE — H&P (Signed)
Referring Physician(s): Haygood,V  Supervising Physician: Markus Daft  Patient Status:  [facility] OP TBA  Chief Complaint:  Symptomatic uterine fibroids  Subjective: Patient familiar to IR service from prior consultation with Dr. Anselm Pancoast on 04/22/17 to discuss treatment options for symptomatic uterine fibroids. She was deemed an appropriate candidate for bilateral uterine artery embolization and presents today for the procedure. She currently denies fever,HA,CP, dyspnea, cough, abd pain, N/V, hematuria, dysuria or blood in stool. She does have history of menorrhagia and abd/pelvic fullness.  Past Medical History:  Diagnosis Date  . Anemia   . Bacterial vaginosis 12/06/10  . Candida vaginitis 05/1998  . Chlamydia 01/1993  . Dysplasia of cervix, low grade (CIN 1)    with negative ECC  . H/O dysmenorrhea 2013  . H/O fatigue   . H/O menorrhagia 12/06/10  . History of HPV infection   . History of UTI 05/1998  . Hx of urinary frequency 01/02/12  . Hypertension   . Lower back pain 12/10/2007  . Uterine fibroid 2010  . Yeast infection    Past Surgical History:  Procedure Laterality Date  . history of biopsy     05/2017  . IR RADIOLOGIST EVAL & MGMT  04/22/2017  . TONSILLECTOMY        Allergies: Naprosyn [naproxen]  Medications: Prior to Admission medications   Medication Sig Start Date End Date Taking? Authorizing Provider  amLODipine (NORVASC) 10 MG tablet Take 10 mg by mouth daily.   Yes [provider]  ferrous sulfate 325 (65 FE) MG tablet Take 325 mg by mouth daily with breakfast.   Yes [provider]  hydrochlorothiazide (HYDRODIURIL) 25 MG tablet Take 25 mg by mouth daily.   Yes [provider]  HYDROcodone-acetaminophen (VICODIN) 2.5-500 MG per tablet Take 1 tablet by mouth every 6 (six) hours as needed.   Yes [provider]  BORIC ACID EX Apply topically.    [provider]  Boric Acid POWD by Does not apply route.    [provider]  medroxyPROGESTERone (PROVERA) 10 MG tablet ONE BY MOUTH EVERY 6 HOURS UNTIL BLEEDING STOPS;THEN 1 BY MOUTH DAILY FOR 14 DAYS Patient not taking: Reported on 04/22/2017 06/26/12   Earnstine Regal, PA-C  naproxen (NAPROSYN) 500 MG tablet Take 500 mg by mouth 2 (two) times daily with a meal.    [provider]     Vital Signs: BP (!) 163/109 Comment: right arm  Pulse 87   Temp 98.2 F (36.8 C) (Oral)   Resp 18   Ht 5\' 1"  (1.549 m)   Wt 218 lb 6.4 oz (99.1 kg)   LMP 06/27/2017 (Exact Date)   SpO2 98%   BMI 41.27 kg/m   Physical Exam awake, alert. Chest clear to auscultation bilaterally. Heart with regular rate and rhythm. Abdomen soft, positive bowel sounds, nontender. No lower extremity edema.  Imaging: No results found.  Labs:  CBC: No results for input(s): WBC, HGB, HCT, PLT in the last 8760 hours.  COAGS: No results for input(s): INR, APTT in the last 8760 hours.  BMP:  Recent Labs  04/29/17 0839  CREATININE 0.90    LIVER FUNCTION TESTS: No results for input(s): BILITOT, AST, ALT, ALKPHOS, PROT, ALBUMIN in the last 8760 hours.  Assessment and Plan: Patient with history of symptomatic uterine fibroids, status post consultation with Dr. Anselm Pancoast on 04/22/17 to discuss treatment options. She was deemed an appropriate candidate for bilateral uterine artery embolization and presents today for the  procedure. Details/risks of procedure, including but not limited to, internal bleeding, infection, contrast nephropathy, nontarget embolization discussed with patient and family with their understanding and consent. Post procedure she will be admitted to the hospital for overnight observation for pain control.Labs pending.    Electronically Signed: D. Rowe Robert, PA-C 07/10/2017, 12:32 PM   I spent a total of 30 minutes at the the patient's bedside AND on the patient's hospital floor or unit, greater than 50% of which was counseling/coordinating care for  bilateral uterine artery embolization

## 2017-07-10 NOTE — Procedures (Signed)
  Pre-operative Diagnosis: Fibroids       Post-operative Diagnosis: Enlarged uterus and fibroids   Indications: Menorrhagia   Procedure: Bilateral uterine artery embolization  Findings: Enlarged uterus.  Successful embolization of bilateral uterine arteries.   Complications: None     EBL: Minimal  Plan: Keep overnight for observation and pain control.

## 2017-07-10 NOTE — Sedation Documentation (Signed)
Pt states she feels pressure at this time.

## 2017-07-10 NOTE — Sedation Documentation (Signed)
Pt rates cramping pain a 7 out of 10.  Medication given.

## 2017-07-10 NOTE — Consult Note (Signed)
TRH H&P CONSULT   Patient Demographics:    Alice Flores, is a 46 y.o. female  MRN: [id number]   DOB - 1970-12-09  Admit Date - 07/10/2017  Outpatient Primary MD for the patient is London Pepper, MD  Referring MD/NP/PA: Victorio Palm  Outpatient Specialists:   Patient coming from:  home  No chief complaint on file.  Hypertension uncontrolled   HPI:    Alice Flores  is a 47 y.o. female, w hypertension, fibroids, anemia, s/p embolization 8/30  Has bp, 190/97. Typically on norvasc and hydrochlorothiazide.   We are consulted for uncontrolled hypertension.     Review of systems:    In addition to the HPI above,  No Fever-chills, No Headache, No changes with Vision or hearing, No problems swallowing food or Liquids, No Chest pain, Cough or Shortness of Breath, No Nausea or Vommitting, Bowel movements are regular, Slight abdominal discomfort, crampy No Blood in stool or Urine, No dysuria, No new skin rashes or bruises, No new joints pains-aches,  No new weakness, tingling, numbness in any extremity, No recent weight gain or loss, No polyuria, polydypsia or polyphagia, No significant Mental Stressors.  A full 10 point Review of Systems was done, except as stated above, all other Review of Systems were negative.   With Past History of the following :    Past Medical History:  Diagnosis Date  . Anemia   . Bacterial vaginosis 12/06/10  . Candida vaginitis 05/1998  . Chlamydia 01/1993  . Dysplasia of cervix, low grade (CIN 1)    with negative ECC  . H/O dysmenorrhea 2013  . H/O fatigue   . H/O menorrhagia 12/06/10  . History of HPV infection   . History of UTI 05/1998  . Hx of urinary frequency 01/02/12  . Hypertension   . Lower back pain 12/10/2007  . Uterine fibroid 2010  . Yeast infection       Past Surgical History:  Procedure Laterality Date  . history of  biopsy     05/2017  . IR ANGIOGRAM PELVIS SELECTIVE OR SUPRASELECTIVE  07/10/2017  . IR ANGIOGRAM PELVIS SELECTIVE OR SUPRASELECTIVE  07/10/2017  . IR ANGIOGRAM SELECTIVE EACH ADDITIONAL VESSEL  07/10/2017  . IR ANGIOGRAM SELECTIVE EACH ADDITIONAL VESSEL  07/10/2017  . IR EMBO TUMOR ORGAN ISCHEMIA INFARCT INC GUIDE ROADMAPPING  07/10/2017  . IR RADIOLOGIST EVAL & MGMT  04/22/2017  . IR US GUIDE VASC ACCESS RIGHT  07/10/2017  . TONSILLECTOMY        Social History:     Social History  Substance Use Topics  . Smoking status: Former Research scientist (life sciences)  . Smokeless tobacco: Never Used     Comment: long time ago  . Alcohol use Yes     Comment: socially     Lives - at home  Mobility - walks by self  Family History :  Family History  Problem Relation Age of Onset  . Diabetes Maternal Grandmother   . Heart disease Maternal Grandmother   . Diabetes Mother   . Hypertension Mother   . Diabetes Sister       Home Medications:   Prior to Admission medications   Medication Sig Start Date End Date Taking? Authorizing Provider  amLODipine (NORVASC) 10 MG tablet Take 10 mg by mouth daily.   Yes [provider]  ferrous sulfate 325 (65 FE) MG tablet Take 325 mg by mouth daily with breakfast.   Yes [provider]  hydrochlorothiazide (HYDRODIURIL) 25 MG tablet Take 25 mg by mouth daily.   Yes [provider]  HYDROcodone-acetaminophen (VICODIN) 2.5-500 MG per tablet Take 1 tablet by mouth every 6 (six) hours as needed.   Yes [provider]  BORIC ACID EX Apply topically.    [provider]  Boric Acid POWD by Does not apply route.    [provider]  medroxyPROGESTERone (PROVERA) 10 MG tablet ONE BY MOUTH EVERY 6 HOURS UNTIL BLEEDING STOPS;THEN 1 BY MOUTH DAILY FOR 14 DAYS Patient not taking: Reported on 04/22/2017 06/26/12   Earnstine Regal, PA-C  naproxen (NAPROSYN) 500 MG tablet Take 500 mg by mouth 2 (two) times daily with a meal.    [provider]     Allergies:     Allergies  Allergen Reactions  . Naprosyn [Naproxen] Itching     Physical Exam:   Vitals  Blood pressure (!) 181/95, pulse 64, temperature 97.6 F (36.4 C), temperature source Oral, resp. rate 15, height 5\' 1"  (1.549 m), weight 99.1 kg (218 lb 6.4 oz), last menstrual period 06/27/2017, SpO2 96 %.   1. General  lying in bed in NAD,    2. Normal affect and insight, Not Suicidal or Homicidal, Awake Alert, Oriented X 3.  3. No F.N deficits, ALL C.Nerves Intact, Strength 5/5 all 4 extremities, Sensation intact all 4 extremities, Plantars down going.  4. Ears and Eyes appear Normal, Conjunctivae clear, PERRLA. Moist Oral Mucosa.  5. Supple Neck, No JVD, No cervical lymphadenopathy appriciated, No Carotid Bruits.  6. Symmetrical Chest wall movement, Good air movement bilaterally, CTAB.  7. RRR, No Gallops, Rubs or Murmurs, No Parasternal Heave.  8. Positive Bowel Sounds, Abdomen Soft, No tenderness, No organomegaly appriciated,No rebound -guarding or rigidity.  9.  No Cyanosis, Normal Skin Turgor, No Skin Rash or Bruise.  10. Good muscle tone,  joints appear normal , no effusions, Normal ROM.  11. No Palpable Lymph Nodes in Neck or Axillae     Data Review:    CBC  Recent Labs Lab 07/10/17 1234  WBC 6.3  HGB 11.6*  HCT 34.9*  PLT 240  MCV 81.2  MCH 27.0  MCHC 33.2  RDW 14.0   ------------------------------------------------------------------------------------------------------------------  Chemistries   Recent Labs Lab 07/10/17 1234  NA 140  K 3.7  CL 103  CO2 29  GLUCOSE 104*  BUN 11  CREATININE 0.95  CALCIUM 8.8*   ------------------------------------------------------------------------------------------------------------------ estimated creatinine clearance is 79.8 mL/min (by C-G formula based on SCr of 0.95  mg/dL). ------------------------------------------------------------------------------------------------------------------ No results for input(s): TSH, T4TOTAL, T3FREE, THYROIDAB in the last 72 hours.  Invalid input(s): FREET3  Coagulation profile  Recent Labs Lab 07/10/17 1234  INR 1.02   ------------------------------------------------------------------------------------------------------------------- No results for input(s): DDIMER in the last 72 hours. -------------------------------------------------------------------------------------------------------------------  Cardiac Enzymes No results for input(s): CKMB, TROPONINI, MYOGLOBIN in the last 168 hours.  Invalid input(s): CK ------------------------------------------------------------------------------------------------------------------  No results found for: BNP   ---------------------------------------------------------------------------------------------------------------  Urinalysis No results found for: COLORURINE, APPEARANCEUR, LABSPEC, [place], GLUCOSEU, HGBUR, BILIRUBINUR, KETONESUR, PROTEINUR, UROBILINOGEN, NITRITE, LEUKOCYTESUR  ----------------------------------------------------------------------------------------------------------------   Imaging Results:    Ir Angiogram Pelvis Selective Or Supraselective  Result Date: 07/10/2017 INDICATION: 46 year old with uterine fibroids, menorrhagia and dysmenorrhea. EXAM: BILATERAL UTERINE ARTERY EMBOLIZATION MEDICATIONS: Ancef 2 g. The antibiotic was administered within 1 hour of the procedure. Zofran 4 mg. Toradol 30 mg IV. Lopressor 5 mg, Hydralazine 10 mg ANESTHESIA/SEDATION: Fentanyl 300 mcg IV; Versed 2 mg IV Moderate Sedation Time:  112 minutes The patient was continuously monitored during the procedure by the interventional radiology nurse under my direct supervision. CONTRAST:  53mL ISOVUE-300 IOPAMIDOL (ISOVUE-300) INJECTION 61% FLUOROSCOPY TIME:  Fluoroscopy  Time: 36 minutes 36 seconds (1602 mGy). COMPLICATIONS: None immediate. PROCEDURE: Informed consent was obtained from the patient following explanation of the procedure, risks, benefits and alternatives. The patient understands, agrees and consents for the procedure. All questions were addressed. A time out was performed prior to the initiation of the procedure. Maximal barrier sterile technique utilized including caps, mask, sterile gowns, sterile gloves, large sterile drape, hand hygiene, and Betadine prep. The patient was placed supine on the interventional table. Ultrasound confirmed a patent right common femoral artery. The right groin was prepped and draped in a sterile fashion. Maximal barrier sterile technique was utilized including caps, mask, sterile gowns, sterile gloves, sterile drape, hand hygiene and skin antiseptic. The skin was anesthetized 1% lidocaine. Using ultrasound guidance, 21 gauge needle was directed in the right common femoral artery and a micropuncture dilator set was placed. The vascular access was upsized to a 5-French vascular sheath. A Cobra catheter was used to cannulate the left common iliac artery and the left internal iliac artery. A series of arteriograms were performed to identify the left uterine artery orfice. A Progreat microcatheter was advanced into the left uterine artery. 1.75 vials of Embospheres (500 - 700 micron) were injected through the microcatheter with fluoroscopic guidance. There was near complete stasis of the left uterine artery following administration of the particles. A Waltman's loop was formed using the Cobra catheter but it is difficult to cannulate the right common iliac artery with this catheter. Therefore, a Mancel Bale uterine catheter was formed over the aortic bifurcation and used to cannulate the right common iliac artery and right internal iliac artery. The right uterine artery was identified with contrast angiograms. The microcatheter was advanced into  the right uterine artery and a series of angiograms were performed. 2.25 vials of Embospheres (500-700 micron) were injected through the microcatheter with fluoroscopic guidance. There was near complete stasis of the right uterine artery at the end of the procedure. The microcatheter was removed. The Roberts catheter was straightened out over the aortic bifurcation and removed over a wire. An angiogram performed through the right groin sheath. The right groin sheath was removed with an Exoseal closure device. Hemostasis in the groin at the end of the procedure. Fluoroscopic and ultrasound images were taken and saved for documentation. FINDINGS: Large uterine arteries bilaterally. Near complete stasis of the uterine arteries at the end of the procedure. IMPRESSION: Successful uterine artery embolization procedure. Electronically Signed   By: Markus Daft M.D.   On: 07/10/2017 16:55   Ir Angiogram Pelvis Selective Or Supraselective  Result Date: 07/10/2017 INDICATION: 46 year old with uterine fibroids, menorrhagia and dysmenorrhea. EXAM: BILATERAL UTERINE ARTERY EMBOLIZATION MEDICATIONS: Ancef 2 g. The antibiotic was administered within 1 hour of the procedure. Zofran 4  mg. Toradol 30 mg IV. Lopressor 5 mg, Hydralazine 10 mg ANESTHESIA/SEDATION: Fentanyl 300 mcg IV; Versed 2 mg IV Moderate Sedation Time:  112 minutes The patient was continuously monitored during the procedure by the interventional radiology nurse under my direct supervision. CONTRAST:  59mL ISOVUE-300 IOPAMIDOL (ISOVUE-300) INJECTION 61% FLUOROSCOPY TIME:  Fluoroscopy Time: 36 minutes 36 seconds (1602 mGy). COMPLICATIONS: None immediate. PROCEDURE: Informed consent was obtained from the patient following explanation of the procedure, risks, benefits and alternatives. The patient understands, agrees and consents for the procedure. All questions were addressed. A time out was performed prior to the initiation of the procedure. Maximal barrier sterile  technique utilized including caps, mask, sterile gowns, sterile gloves, large sterile drape, hand hygiene, and Betadine prep. The patient was placed supine on the interventional table. Ultrasound confirmed a patent right common femoral artery. The right groin was prepped and draped in a sterile fashion. Maximal barrier sterile technique was utilized including caps, mask, sterile gowns, sterile gloves, sterile drape, hand hygiene and skin antiseptic. The skin was anesthetized 1% lidocaine. Using ultrasound guidance, 21 gauge needle was directed in the right common femoral artery and a micropuncture dilator set was placed. The vascular access was upsized to a 5-French vascular sheath. A Cobra catheter was used to cannulate the left common iliac artery and the left internal iliac artery. A series of arteriograms were performed to identify the left uterine artery orfice. A Progreat microcatheter was advanced into the left uterine artery. 1.75 vials of Embospheres (500 - 700 micron) were injected through the microcatheter with fluoroscopic guidance. There was near complete stasis of the left uterine artery following administration of the particles. A Waltman's loop was formed using the Cobra catheter but it is difficult to cannulate the right common iliac artery with this catheter. Therefore, a Mancel Bale uterine catheter was formed over the aortic bifurcation and used to cannulate the right common iliac artery and right internal iliac artery. The right uterine artery was identified with contrast angiograms. The microcatheter was advanced into the right uterine artery and a series of angiograms were performed. 2.25 vials of Embospheres (500-700 micron) were injected through the microcatheter with fluoroscopic guidance. There was near complete stasis of the right uterine artery at the end of the procedure. The microcatheter was removed. The Roberts catheter was straightened out over the aortic bifurcation and removed over a  wire. An angiogram performed through the right groin sheath. The right groin sheath was removed with an Exoseal closure device. Hemostasis in the groin at the end of the procedure. Fluoroscopic and ultrasound images were taken and saved for documentation. FINDINGS: Large uterine arteries bilaterally. Near complete stasis of the uterine arteries at the end of the procedure. IMPRESSION: Successful uterine artery embolization procedure. Electronically Signed   By: Markus Daft M.D.   On: 07/10/2017 16:55   Ir Angiogram Selective Each Additional Vessel  Result Date: 07/10/2017 INDICATION: 46 year old with uterine fibroids, menorrhagia and dysmenorrhea. EXAM: BILATERAL UTERINE ARTERY EMBOLIZATION MEDICATIONS: Ancef 2 g. The antibiotic was administered within 1 hour of the procedure. Zofran 4 mg. Toradol 30 mg IV. Lopressor 5 mg, Hydralazine 10 mg ANESTHESIA/SEDATION: Fentanyl 300 mcg IV; Versed 2 mg IV Moderate Sedation Time:  112 minutes The patient was continuously monitored during the procedure by the interventional radiology nurse under my direct supervision. CONTRAST:  46mL ISOVUE-300 IOPAMIDOL (ISOVUE-300) INJECTION 61% FLUOROSCOPY TIME:  Fluoroscopy Time: 36 minutes 36 seconds (1602 mGy). COMPLICATIONS: None immediate. PROCEDURE: Informed consent was obtained from the patient following  explanation of the procedure, risks, benefits and alternatives. The patient understands, agrees and consents for the procedure. All questions were addressed. A time out was performed prior to the initiation of the procedure. Maximal barrier sterile technique utilized including caps, mask, sterile gowns, sterile gloves, large sterile drape, hand hygiene, and Betadine prep. The patient was placed supine on the interventional table. Ultrasound confirmed a patent right common femoral artery. The right groin was prepped and draped in a sterile fashion. Maximal barrier sterile technique was utilized including caps, mask, sterile gowns,  sterile gloves, sterile drape, hand hygiene and skin antiseptic. The skin was anesthetized 1% lidocaine. Using ultrasound guidance, 21 gauge needle was directed in the right common femoral artery and a micropuncture dilator set was placed. The vascular access was upsized to a 5-French vascular sheath. A Cobra catheter was used to cannulate the left common iliac artery and the left internal iliac artery. A series of arteriograms were performed to identify the left uterine artery orfice. A Progreat microcatheter was advanced into the left uterine artery. 1.75 vials of Embospheres (500 - 700 micron) were injected through the microcatheter with fluoroscopic guidance. There was near complete stasis of the left uterine artery following administration of the particles. A Waltman's loop was formed using the Cobra catheter but it is difficult to cannulate the right common iliac artery with this catheter. Therefore, a Mancel Bale uterine catheter was formed over the aortic bifurcation and used to cannulate the right common iliac artery and right internal iliac artery. The right uterine artery was identified with contrast angiograms. The microcatheter was advanced into the right uterine artery and a series of angiograms were performed. 2.25 vials of Embospheres (500-700 micron) were injected through the microcatheter with fluoroscopic guidance. There was near complete stasis of the right uterine artery at the end of the procedure. The microcatheter was removed. The Roberts catheter was straightened out over the aortic bifurcation and removed over a wire. An angiogram performed through the right groin sheath. The right groin sheath was removed with an Exoseal closure device. Hemostasis in the groin at the end of the procedure. Fluoroscopic and ultrasound images were taken and saved for documentation. FINDINGS: Large uterine arteries bilaterally. Near complete stasis of the uterine arteries at the end of the procedure. IMPRESSION:  Successful uterine artery embolization procedure. Electronically Signed   By: Markus Daft M.D.   On: 07/10/2017 16:55   Ir Angiogram Selective Each Additional Vessel  Result Date: 07/10/2017 Markus Daft, MD     07/10/2017  4:05 PM Pre-operative Diagnosis: Fibroids    Post-operative Diagnosis: Enlarged uterus and fibroids Indications: Menorrhagia Procedure: Bilateral uterine artery embolization Findings: Enlarged uterus.  Successful embolization of bilateral uterine arteries. Complications: None    EBL: Minimal Plan: Keep overnight for observation and pain control.    Ir US Guide Vasc Access Right  Result Date: 07/10/2017 INDICATION: 46 year old with uterine fibroids, menorrhagia and dysmenorrhea. EXAM: BILATERAL UTERINE ARTERY EMBOLIZATION MEDICATIONS: Ancef 2 g. The antibiotic was administered within 1 hour of the procedure. Zofran 4 mg. Toradol 30 mg IV. Lopressor 5 mg, Hydralazine 10 mg ANESTHESIA/SEDATION: Fentanyl 300 mcg IV; Versed 2 mg IV Moderate Sedation Time:  112 minutes The patient was continuously monitored during the procedure by the interventional radiology nurse under my direct supervision. CONTRAST:  60mL ISOVUE-300 IOPAMIDOL (ISOVUE-300) INJECTION 61% FLUOROSCOPY TIME:  Fluoroscopy Time: 36 minutes 36 seconds (1602 mGy). COMPLICATIONS: None immediate. PROCEDURE: Informed consent was obtained from the patient following explanation of the procedure, risks, benefits  and alternatives. The patient understands, agrees and consents for the procedure. All questions were addressed. A time out was performed prior to the initiation of the procedure. Maximal barrier sterile technique utilized including caps, mask, sterile gowns, sterile gloves, large sterile drape, hand hygiene, and Betadine prep. The patient was placed supine on the interventional table. Ultrasound confirmed a patent right common femoral artery. The right groin was prepped and draped in a sterile fashion. Maximal barrier sterile  technique was utilized including caps, mask, sterile gowns, sterile gloves, sterile drape, hand hygiene and skin antiseptic. The skin was anesthetized 1% lidocaine. Using ultrasound guidance, 21 gauge needle was directed in the right common femoral artery and a micropuncture dilator set was placed. The vascular access was upsized to a 5-French vascular sheath. A Cobra catheter was used to cannulate the left common iliac artery and the left internal iliac artery. A series of arteriograms were performed to identify the left uterine artery orfice. A Progreat microcatheter was advanced into the left uterine artery. 1.75 vials of Embospheres (500 - 700 micron) were injected through the microcatheter with fluoroscopic guidance. There was near complete stasis of the left uterine artery following administration of the particles. A Waltman's loop was formed using the Cobra catheter but it is difficult to cannulate the right common iliac artery with this catheter. Therefore, a Mancel Bale uterine catheter was formed over the aortic bifurcation and used to cannulate the right common iliac artery and right internal iliac artery. The right uterine artery was identified with contrast angiograms. The microcatheter was advanced into the right uterine artery and a series of angiograms were performed. 2.25 vials of Embospheres (500-700 micron) were injected through the microcatheter with fluoroscopic guidance. There was near complete stasis of the right uterine artery at the end of the procedure. The microcatheter was removed. The Roberts catheter was straightened out over the aortic bifurcation and removed over a wire. An angiogram performed through the right groin sheath. The right groin sheath was removed with an Exoseal closure device. Hemostasis in the groin at the end of the procedure. Fluoroscopic and ultrasound images were taken and saved for documentation. FINDINGS: Large uterine arteries bilaterally. Near complete stasis of the  uterine arteries at the end of the procedure. IMPRESSION: Successful uterine artery embolization procedure. Electronically Signed   By: Markus Daft M.D.   On: 07/10/2017 16:55   Ir Embo Tumor Organ Ischemia Infarct Inc Guide Roadmapping  Result Date: 07/10/2017 INDICATION: 46 year old with uterine fibroids, menorrhagia and dysmenorrhea. EXAM: BILATERAL UTERINE ARTERY EMBOLIZATION MEDICATIONS: Ancef 2 g. The antibiotic was administered within 1 hour of the procedure. Zofran 4 mg. Toradol 30 mg IV. Lopressor 5 mg, Hydralazine 10 mg ANESTHESIA/SEDATION: Fentanyl 300 mcg IV; Versed 2 mg IV Moderate Sedation Time:  112 minutes The patient was continuously monitored during the procedure by the interventional radiology nurse under my direct supervision. CONTRAST:  58mL ISOVUE-300 IOPAMIDOL (ISOVUE-300) INJECTION 61% FLUOROSCOPY TIME:  Fluoroscopy Time: 36 minutes 36 seconds (1602 mGy). COMPLICATIONS: None immediate. PROCEDURE: Informed consent was obtained from the patient following explanation of the procedure, risks, benefits and alternatives. The patient understands, agrees and consents for the procedure. All questions were addressed. A time out was performed prior to the initiation of the procedure. Maximal barrier sterile technique utilized including caps, mask, sterile gowns, sterile gloves, large sterile drape, hand hygiene, and Betadine prep. The patient was placed supine on the interventional table. Ultrasound confirmed a patent right common femoral artery. The right groin was prepped and draped  in a sterile fashion. Maximal barrier sterile technique was utilized including caps, mask, sterile gowns, sterile gloves, sterile drape, hand hygiene and skin antiseptic. The skin was anesthetized 1% lidocaine. Using ultrasound guidance, 21 gauge needle was directed in the right common femoral artery and a micropuncture dilator set was placed. The vascular access was upsized to a 5-French vascular sheath. A Cobra  catheter was used to cannulate the left common iliac artery and the left internal iliac artery. A series of arteriograms were performed to identify the left uterine artery orfice. A Progreat microcatheter was advanced into the left uterine artery. 1.75 vials of Embospheres (500 - 700 micron) were injected through the microcatheter with fluoroscopic guidance. There was near complete stasis of the left uterine artery following administration of the particles. A Waltman's loop was formed using the Cobra catheter but it is difficult to cannulate the right common iliac artery with this catheter. Therefore, a Mancel Bale uterine catheter was formed over the aortic bifurcation and used to cannulate the right common iliac artery and right internal iliac artery. The right uterine artery was identified with contrast angiograms. The microcatheter was advanced into the right uterine artery and a series of angiograms were performed. 2.25 vials of Embospheres (500-700 micron) were injected through the microcatheter with fluoroscopic guidance. There was near complete stasis of the right uterine artery at the end of the procedure. The microcatheter was removed. The Roberts catheter was straightened out over the aortic bifurcation and removed over a wire. An angiogram performed through the right groin sheath. The right groin sheath was removed with an Exoseal closure device. Hemostasis in the groin at the end of the procedure. Fluoroscopic and ultrasound images were taken and saved for documentation. FINDINGS: Large uterine arteries bilaterally. Near complete stasis of the uterine arteries at the end of the procedure. IMPRESSION: Successful uterine artery embolization procedure. Electronically Signed   By: Markus Daft M.D.   On: 07/10/2017 16:55      Assessment & Plan:    Active Problems:   Menorrhagia with regular cycle   Accelerated hypertension    Hypertension uncontrolled Likely due post op pain Hydralazine 10mg  iv q6h  prn sbp >160, if not having good response to hydralazine will consider clonidine 0.1mg  po q6h prn sbp >160  Menorrhagia secondary to fibroids  S/p embolization 8/30 by IR      DVT Prophylaxis SCDs   AM Labs Ordered, also please review Full Orders  Family Communication: Admission, patients condition and plan of care including tests being ordered have been discussed with the patient  who indicate understanding and agree with the plan and Code Status.  Code Status FULL CODE  Likely DC to  home  Condition GUARDED   Admission status: observation  Time spent in minutes : 45 minutes   Jani Gravel M.D on 07/10/2017 at 6:33 PM  Between 7am to 7pm - Pager - [phone]. After 7pm go to [email or web address] - password [facility]  [facility] - Office  [phone]

## 2017-07-10 NOTE — Sedation Documentation (Signed)
Pressure applied at right groin site

## 2017-07-10 NOTE — Sedation Documentation (Signed)
Pt reports pain a 5 out of 10.

## 2017-07-11 ENCOUNTER — Other Ambulatory Visit: Payer: Self-pay | Admitting: Radiology

## 2017-07-11 DIAGNOSIS — I10 Essential (primary) hypertension: Secondary | ICD-10-CM | POA: Diagnosis not present

## 2017-07-11 DIAGNOSIS — D259 Leiomyoma of uterus, unspecified: Secondary | ICD-10-CM

## 2017-07-11 DIAGNOSIS — N920 Excessive and frequent menstruation with regular cycle: Secondary | ICD-10-CM | POA: Diagnosis not present

## 2017-07-11 LAB — COMPREHENSIVE METABOLIC PANEL
ALK PHOS: 102 U/L (ref 38–126)
ALT: 15 U/L (ref 14–54)
AST: 23 U/L (ref 15–41)
Albumin: 4.3 g/dL (ref 3.5–5.0)
Anion gap: 13 (ref 5–15)
BUN: 9 mg/dL (ref 6–20)
CALCIUM: 9.1 mg/dL (ref 8.9–10.3)
CHLORIDE: 97 mmol/L — AB (ref 101–111)
CO2: 27 mmol/L (ref 22–32)
CREATININE: 0.79 mg/dL (ref 0.44–1.00)
GFR calc non Af Amer: >60 mL/min (ref >60)
Glucose, Bld: 110 mg/dL — ABNORMAL HIGH (ref 65–99)
Potassium: 4 mmol/L (ref 3.5–5.1)
SODIUM: 137 mmol/L (ref 135–145)
Total Bilirubin: 0.7 mg/dL (ref 0.3–1.2)
Total Protein: 8.3 g/dL — ABNORMAL HIGH (ref 6.5–8.1)

## 2017-07-11 LAB — CBC
HCT: 42 % (ref 36.0–46.0)
Hemoglobin: 14.3 g/dL (ref 12.0–15.0)
MCH: 27.4 pg (ref 26.0–34.0)
MCHC: 34 g/dL (ref 30.0–36.0)
MCV: 80.5 fL (ref 78.0–100.0)
Platelets: 247 10*3/uL (ref 150–400)
RBC: 5.22 MIL/uL — AB (ref 3.87–5.11)
RDW: 13.8 % (ref 11.5–15.5)
WBC: 10 10*3/uL (ref 4.0–10.5)

## 2017-07-11 MED ORDER — HYDRALAZINE HCL 25 MG PO TABS
25.0000 mg | ORAL_TABLET | Freq: Three times a day (TID) | ORAL | Status: DC
  Administered 2017-07-11 (×2): 25 mg via ORAL
  Filled 2017-07-11 (×2): qty 1

## 2017-07-11 MED ORDER — HYDROCODONE-ACETAMINOPHEN 5-325 MG PO TABS
1.0000 | ORAL_TABLET | ORAL | Status: DC | PRN
  Administered 2017-07-11: 2 via ORAL
  Administered 2017-07-11: 1 via ORAL
  Filled 2017-07-11: qty 2
  Filled 2017-07-11: qty 1

## 2017-07-11 MED ORDER — HYDRALAZINE HCL 25 MG PO TABS: 25.0000 mg | ORAL_TABLET | Freq: Three times a day (TID) | ORAL | 0 refills | Status: AC

## 2017-07-11 NOTE — Discharge Instructions (Signed)
Uterine Artery Embolization for Fibroids, Care After °Refer to this sheet in the next few weeks. These instructions provide you with information on caring for yourself after your procedure. Your health care provider may also give you more specific instructions. Your treatment has been planned according to current medical practices, but problems sometimes occur. Call your health care provider if you have any problems or questions after your procedure. °What can I expect after the procedure? °After your procedure, it is typical to have cramping in the pelvis. You will be given pain medicine to control it. °Follow these instructions at home: °· Only take over-the-counter or prescription medicines for pain, discomfort, or fever as directed by your health care provider. °· Do not take aspirin. It can cause bleeding. °· Follow your health care provider’s advice regarding medicines given to you, diet, activity, and when to begin sexual activity. °· See your health care provider for follow-up care as directed. °Contact a health care provider if: °· You have a fever. °· You have redness, swelling, and pain around your incision site. °· You have pus draining from your incision. °· You have a rash. °Get help right away if: °· You have bleeding from your incision site. °· You have difficulty breathing. °· You have chest pain. °· You have severe abdominal pain. °· You have leg pain. °· You become dizzy and faint. °This information is not intended to replace advice given to you by your health care provider. Make sure you discuss any questions you have with your health care provider. °Document Released: 08/18/2013 Document Revised: 04/04/2016 Document Reviewed: 05/13/2013 °Elsevier Interactive Patient Education © 2018 [employer] ° °

## 2017-07-11 NOTE — Progress Notes (Signed)
Wasted 15 ml of dilaudid from a PCA pump into the sink with Benedetto Goad, RN witnessing the waste.

## 2017-07-11 NOTE — Discharge Summary (Signed)
Patient ID: Alice Flores MRN: [id number] DOB/AGE: 1971/09/10 46 y.o.  Admit date: 07/10/2017 Discharge date: 07/11/2017  Supervising Physician: Markus Daft  Patient Status: [facility] - In-pt  Admission Diagnoses: Symptomatic uterine fibroids  Discharge Diagnoses: Symptomatic uterine fibroids, status post successful bilateral uterine artery embolization on 07/10/17 Active Problems:   Menorrhagia with regular cycle   Accelerated hypertension  Past Medical History:  Diagnosis Date  . Anemia   . Bacterial vaginosis 12/06/10  . Candida vaginitis 05/1998  . Chlamydia 01/1993  . Dysplasia of cervix, low grade (CIN 1)    with negative ECC  . H/O dysmenorrhea 2013  . H/O fatigue   . H/O menorrhagia 12/06/10  . History of HPV infection   . History of UTI 05/1998  . Hx of urinary frequency 01/02/12  . Hypertension   . Lower back pain 12/10/2007  . Uterine fibroid 2010  . Yeast infection    Past Surgical History:  Procedure Laterality Date  . history of biopsy     05/2017  . IR ANGIOGRAM PELVIS SELECTIVE OR SUPRASELECTIVE  07/10/2017  . IR ANGIOGRAM PELVIS SELECTIVE OR SUPRASELECTIVE  07/10/2017  . IR ANGIOGRAM SELECTIVE EACH ADDITIONAL VESSEL  07/10/2017  . IR ANGIOGRAM SELECTIVE EACH ADDITIONAL VESSEL  07/10/2017  . IR EMBO TUMOR ORGAN ISCHEMIA INFARCT INC GUIDE ROADMAPPING  07/10/2017  . IR RADIOLOGIST EVAL & MGMT  04/22/2017  . IR US GUIDE VASC ACCESS RIGHT  07/10/2017  . TONSILLECTOMY        Discharged Condition: good  Hospital Course:  Ms. Alice Flores is a 46 year old female with history of symptomatic uterine fibroids who was seen in consultation by Dr. Anselm Pancoast on 04/22/17 to discuss treatment options. She was deemed an appropriate candidate for bilateral uterine artery embolization. She underwent the procedure at [facility] 07/10/17 via IV conscious sedation. The procedure was performed without immediate complications and she was admitted to the hospital for overnight observation for  pain control. She was placed on a Dilaudid PCA pump and given antiemetics as needed. Overnight the patient did fairly well with some intermittent nausea as well as expected pelvic cramping. On the day of discharge she was able to ambulate, void and tolerate her diet without significant difficulty. She continued to have some mild pelvic cramping and mild nausea earlier today which resolved with antiemetics. Due to persistent hypertension patient was evaluated by [facility] and placed on hydralazine with clonidine option if needed. BP responded nicely and on discharge was 153/81. Follow-up CBC and comprehensive metabolic panel were stable. Case was discussed with Dr. Anselm Pancoast and she was deemed stable for discharge at this time. She was given prescriptions for ibuprofen 600 mg, #20, no refills, 1 tablet every 6 hours for the next 5 days; Colace 100 mg, #20, no refills, 1 tablet twice daily as needed for constipation; Norco 5/325, #30, no refills, 1-2 tablets every 4-6 hours as needed for moderate to severe pain; Zofran 8 mg, #20, no refills, 1 tablet twice daily as needed for nausea. She was also prescribed hydralazine 25 mg , # 90, no refill, one tablet every 8 hours by Dr. Pietro Cassis [facility]. She'll be scheduled for follow-up in the IR clinic in the next 3-4 weeks. She will follow-up with Dr. Leo Grosser as needed and continue primary care follow-up with her physician at [facility] [place]. She was told to contact our service in the interval with any additional questions or concerns.  Consults: [facility]  Significant Diagnostic Studies:  Results for orders  placed or performed during the hospital encounter of 07/10/17  APTT  Result Value Ref Range   aPTT 33 24 - 36 seconds  Basic metabolic panel  Result Value Ref Range   Sodium 140 135 - 145 mmol/L   Potassium 3.7 3.5 - 5.1 mmol/L   Chloride 103 101 - 111 mmol/L   CO2 29 22 - 32 mmol/L   Glucose, Bld 104 (H) 65 - 99 mg/dL   BUN 11 6 - 20 mg/dL    Creatinine, Ser 0.95 0.44 - 1.00 mg/dL   Calcium 8.8 (L) 8.9 - 10.3 mg/dL   GFR calc non Af Amer >60 >60 mL/min   GFR calc Af Amer >60 >60 mL/min   Anion gap 8 5 - 15  CBC  Result Value Ref Range   WBC 6.3 4.0 - 10.5 K/uL   RBC 4.30 3.87 - 5.11 MIL/uL   Hemoglobin 11.6 (L) 12.0 - 15.0 g/dL   HCT 34.9 (L) 36.0 - 46.0 %   MCV 81.2 78.0 - 100.0 fL   MCH 27.0 26.0 - 34.0 pg   MCHC 33.2 30.0 - 36.0 g/dL   RDW 14.0 11.5 - 15.5 %   Platelets 240 150 - 400 K/uL  Protime-INR  Result Value Ref Range   Prothrombin Time 13.3 11.4 - 15.2 seconds   INR 1.02   hCG, serum, qualitative  Result Value Ref Range   Preg, Serum NEGATIVE NEGATIVE  CBC  Result Value Ref Range   WBC 10.0 4.0 - 10.5 K/uL   RBC 5.22 (H) 3.87 - 5.11 MIL/uL   Hemoglobin 14.3 12.0 - 15.0 g/dL   HCT 42.0 36.0 - 46.0 %   MCV 80.5 78.0 - 100.0 fL   MCH 27.4 26.0 - 34.0 pg   MCHC 34.0 30.0 - 36.0 g/dL   RDW 13.8 11.5 - 15.5 %   Platelets 247 150 - 400 K/uL  Comprehensive metabolic panel  Result Value Ref Range   Sodium 137 135 - 145 mmol/L   Potassium 4.0 3.5 - 5.1 mmol/L   Chloride 97 (L) 101 - 111 mmol/L   CO2 27 22 - 32 mmol/L   Glucose, Bld 110 (H) 65 - 99 mg/dL   BUN 9 6 - 20 mg/dL   Creatinine, Ser 0.79 0.44 - 1.00 mg/dL   Calcium 9.1 8.9 - 10.3 mg/dL   Total Protein 8.3 (H) 6.5 - 8.1 g/dL   Albumin 4.3 3.5 - 5.0 g/dL   AST 23 15 - 41 U/L   ALT 15 14 - 54 U/L   Alkaline Phosphatase 102 38 - 126 U/L   Total Bilirubin 0.7 0.3 - 1.2 mg/dL   GFR calc non Af Amer >60 >60 mL/min   GFR calc Af Amer >60 >60 mL/min   Anion gap 13 5 - 15     Treatments: Successful bilateral uterine artery embolization on 07/10/17 via IV conscious sedation  Discharge Exam: Blood pressure (!) 153/81, pulse 61, temperature 97.7 F (36.5 C), temperature source Oral, resp. rate 16, height 5\' 1"  (1.549 m), weight 218 lb 6.4 oz (99.1 kg), last menstrual period 06/27/2017, SpO2 100 %. Awake, alert. Chest clear to auscultation  bilaterally. Heart with regular rate and rhythm. Abdomen soft, positive bowel sounds, mild pelvic tenderness to palpation, fibroid uterus. Puncture site right common femoral artery clean, dry, nontender, no hematoma. Lower extremities with no edema and intact distal pulses.  Disposition: Home  Discharge Instructions    Call MD for:  difficulty breathing, headache or  visual disturbances    Complete by:  As directed    Call MD for:  extreme fatigue    Complete by:  As directed    Call MD for:  hives    Complete by:  As directed    Call MD for:  persistant dizziness or light-headedness    Complete by:  As directed    Call MD for:  persistant nausea and vomiting    Complete by:  As directed    Call MD for:  redness, tenderness, or signs of infection (pain, swelling, redness, odor or green/yellow discharge around incision site)    Complete by:  As directed    Call MD for:  severe uncontrolled pain    Complete by:  As directed    Call MD for:  temperature >100.4    Complete by:  As directed    Change dressing (specify)    Complete by:  As directed    May change bandage/apply bandaid over right groin site for next 2-3 days. May wash site with soap and water.   Diet - low sodium heart healthy    Complete by:  As directed    Discharge instructions    Complete by:  As directed    Please take medications as prescribed and follow up with primary care physician regarding hypertension   Driving Restrictions    Complete by:  As directed    No driving for 48 hours   Increase activity slowly    Complete by:  As directed    Lifting restrictions    Complete by:  As directed    No heavy lifting for the next 3-4 days   May shower / Bathe    Complete by:  As directed    May walk up steps    Complete by:  As directed    Sexual Activity Restrictions    Complete by:  As directed    No sexual intercourse for 1 week     Allergies as of 07/11/2017      Reactions   Naprosyn [naproxen] Itching       Medication List    STOP taking these medications   naproxen 500 MG tablet Commonly known as:  NAPROSYN     TAKE these medications   amLODipine 10 MG tablet Commonly known as:  NORVASC Take 10 mg by mouth daily.   clonazePAM 0.5 MG tablet Commonly known as:  KLONOPIN Take 1 tablet by mouth daily as needed for anxiety   cyclobenzaprine 5 MG tablet Commonly known as:  FLEXERIL Take 1 tablet by mouth daily as needed for muscle spasms   ferrous sulfate 325 (65 FE) MG tablet Take 325 mg by mouth daily with breakfast.   hydrALAZINE 25 MG tablet Commonly known as:  APRESOLINE Take 1 tablet (25 mg total) by mouth every 8 (eight) hours.   hydrochlorothiazide 25 MG tablet Commonly known as:  HYDRODIURIL Take 25 mg by mouth daily.   KLOR-CON M20 20 MEQ tablet Generic drug:  potassium chloride SA Take 1 tablet by mouth daily   medroxyPROGESTERone 10 MG tablet Commonly known as:  PROVERA Take 1 tablet by mouth daily            Discharge Care Instructions        Start     Ordered   07/11/17 0000  hydrALAZINE (APRESOLINE) 25 MG tablet  Every 8 hours     07/11/17 1246   07/11/17 0000  Increase activity slowly  07/11/17 1342   07/11/17 0000  Diet - low sodium heart healthy     07/11/17 1342   07/11/17 0000  May walk up steps     07/11/17 1342   07/11/17 0000  May shower / Bathe     07/11/17 1342   07/11/17 0000  Driving Restrictions    Comments:  No driving for 48 hours   07/11/17 1342   07/11/17 0000  Lifting restrictions    Comments:  No heavy lifting for the next 3-4 days   07/11/17 1342   07/11/17 0000  Sexual Activity Restrictions    Comments:  No sexual intercourse for 1 week   07/11/17 1342   07/11/17 0000  Call MD for:  temperature >100.4     07/11/17 1342   07/11/17 0000  Call MD for:  persistant nausea and vomiting     07/11/17 1342   07/11/17 0000  Call MD for:  severe uncontrolled pain     07/11/17 1342   07/11/17 0000  Call MD for:  redness,  tenderness, or signs of infection (pain, swelling, redness, odor or green/yellow discharge around incision site)     07/11/17 1342   07/11/17 0000  Call MD for:  difficulty breathing, headache or visual disturbances     07/11/17 1342   07/11/17 0000  Call MD for:  hives     07/11/17 1342   07/11/17 0000  Call MD for:  persistant dizziness or light-headedness     07/11/17 1342   07/11/17 0000  Call MD for:  extreme fatigue     07/11/17 1342   07/11/17 0000  Change dressing (specify)    Comments:  May change bandage/apply bandaid over right groin site for next 2-3 days. May wash site with soap and water.   07/11/17 1342   07/11/17 0000  Discharge instructions    Comments:  Please take medications as prescribed and follow up with primary care physician regarding hypertension   07/11/17 1342     Follow-up Information    Markus Daft, MD Follow up.   Specialty:  Interventional Radiology Why:  Radiology will call you with follow up appointment with Dr. Anselm Pancoast in the IR clinic in the next 3-4 weeks. Call [phone] or [phone] with any questions. Contact information: [address] [address] [phone]        Eldred Manges, MD Follow up.   Specialty:  Obstetrics and Gynecology Why:  Continue follow up with Dr. Leo Grosser as scheduled. Contact information: [address] [place] [address] [phone]            Electronically Signed: D. Rowe Robert, PA-C 07/11/2017, 1:45 PM   I have spent less than 30 minutes discharging Darci Needle.

## 2017-07-11 NOTE — Progress Notes (Signed)
NURSING PROGRESS NOTE  Alice Flores [id number] Discharge Data: 07/11/2017 4:22 PM Attending Provider: Markus Daft, MD RKY:[id number], Marjory Lies, MD     Darci Needle to be D/C'd Home per MD order.  Discussed with the patient the After Visit Summary and all questions fully answered. All IV's discontinued with no bleeding noted. All belongings returned to patient for patient to take home. Patient sent home with written prescriptions for hydralazine, Norco, Zofran, Ibuprofen and Colace.   Last Vital Signs:  Blood pressure (!) 153/81, pulse 61, temperature 97.7 F (36.5 C), temperature source Oral, resp. rate 16, height 5\' 1"  (1.549 m), weight 99.1 kg (218 lb 6.4 oz), last menstrual period 06/27/2017, SpO2 100 %.  Discharge Medication List Allergies as of 07/11/2017      Reactions   Naprosyn [naproxen] Itching      Medication List    STOP taking these medications   naproxen 500 MG tablet Commonly known as:  NAPROSYN     TAKE these medications   amLODipine 10 MG tablet Commonly known as:  NORVASC Take 10 mg by mouth daily.   clonazePAM 0.5 MG tablet Commonly known as:  KLONOPIN Take 1 tablet by mouth daily as needed for anxiety   cyclobenzaprine 5 MG tablet Commonly known as:  FLEXERIL Take 1 tablet by mouth daily as needed for muscle spasms   ferrous sulfate 325 (65 FE) MG tablet Take 325 mg by mouth daily with breakfast.   hydrALAZINE 25 MG tablet Commonly known as:  APRESOLINE Take 1 tablet (25 mg total) by mouth every 8 (eight) hours.   hydrochlorothiazide 25 MG tablet Commonly known as:  HYDRODIURIL Take 25 mg by mouth daily.   KLOR-CON M20 20 MEQ tablet Generic drug:  potassium chloride SA Take 1 tablet by mouth daily   medroxyPROGESTERone 10 MG tablet Commonly known as:  PROVERA Take 1 tablet by mouth daily            Discharge Care Instructions        Start     Ordered   07/11/17 0000  hydrALAZINE (APRESOLINE) 25 MG tablet  Every 8 hours     07/11/17 1246    07/11/17 0000  Increase activity slowly     07/11/17 1342   07/11/17 0000  Diet - low sodium heart healthy     07/11/17 1342   07/11/17 0000  May walk up steps     07/11/17 1342   07/11/17 0000  May shower / Bathe     07/11/17 1342   07/11/17 0000  Driving Restrictions    Comments:  No driving for 48 hours   07/11/17 1342   07/11/17 0000  Lifting restrictions    Comments:  No heavy lifting for the next 3-4 days   07/11/17 1342   07/11/17 0000  Sexual Activity Restrictions    Comments:  No sexual intercourse for 1 week   07/11/17 1342   07/11/17 0000  Call MD for:  temperature >100.4     07/11/17 1342   07/11/17 0000  Call MD for:  persistant nausea and vomiting     07/11/17 1342   07/11/17 0000  Call MD for:  severe uncontrolled pain     07/11/17 1342   07/11/17 0000  Call MD for:  redness, tenderness, or signs of infection (pain, swelling, redness, odor or green/yellow discharge around incision site)     07/11/17 1342   07/11/17 0000  Call MD for:  difficulty breathing, headache or visual disturbances  07/11/17 1342   07/11/17 0000  Call MD for:  hives     07/11/17 1342   07/11/17 0000  Call MD for:  persistant dizziness or light-headedness     07/11/17 1342   07/11/17 0000  Call MD for:  extreme fatigue     07/11/17 1342   07/11/17 0000  Change dressing (specify)    Comments:  May change bandage/apply bandaid over right groin site for next 2-3 days. May wash site with soap and water.   07/11/17 1342   07/11/17 0000  Discharge instructions    Comments:  Please take medications as prescribed and follow up with primary care physician regarding hypertension   07/11/17 1342

## 2017-07-11 NOTE — Progress Notes (Signed)
Pt's brother, Ellean Firman can be contacted at [phone]

## 2017-07-11 NOTE — Progress Notes (Signed)
PROGRESS NOTE    Alice Flores  WYO:[id number] DOB: 23-Nov-1970 DOA: 07/10/2017 PCP: London Pepper, MD   Assessment & Plan:   Active Problems:   Menorrhagia with regular cycle   Accelerated hypertension   1. HTN 1. BP improved with addition of hydralazine 2. Patient has remained hemodynamically stable, tolerating hydralazine 3. Rx for hydralazine placed in chart 4. Patient advised to follow up closely with PCP on discharge for BP medication adjustments 2. Menorrhagia secondary to fibroids 1. S/p embolization by IR 2. Remained stable  Antimicrobials: Anti-infectives    Start     Dose/Rate Route Frequency Ordered Stop   07/10/17 1200  ceFAZolin (ANCEF) IVPB 2g/100 mL premix     2 g 200 mL/hr over 30 Minutes Intravenous To Radiology 07/10/17 1149 07/10/17 1453       Subjective: Eager to go home  Objective: Vitals:   07/11/17 0500 07/11/17 0910 07/11/17 1315 07/11/17 1336  BP: (!) 146/81  (!) 156/86 (!) 153/81  Pulse: 66  61   Resp:  15 16   Temp: 98.1 F (36.7 C)  97.7 F (36.5 C)   TempSrc: Oral  Oral   SpO2: 100% 98% 100%   Weight:      Height:        Intake/Output Summary (Last 24 hours) at 07/11/17 1830 Last data filed at 07/11/17 1315  Gross per 24 hour  Intake           971.83 ml  Output             3075 ml  Net         -2103.17 ml   Filed Weights   07/10/17 1136  Weight: 99.1 kg (218 lb 6.4 oz)    Examination:  General exam: Appears calm and comfortable  Respiratory system: Clear to auscultation. Respiratory effort normal. Cardiovascular system: S1 & S2 heard, RRR. Gastrointestinal system: Abdomen is nondistended, soft and nontender. No organomegaly or masses felt. Normal bowel sounds heard. Central nervous system: Alert and oriented. No focal neurological deficits. Extremities: Symmetric 5 x 5 power. Skin: No rashes, lesions  Psychiatry: Judgement and insight appear normal. Mood & affect appropriate.   Data Reviewed: I have personally  reviewed following labs and imaging studies  CBC:  Recent Labs Lab 07/10/17 1234 07/11/17 0539  WBC 6.3 10.0  HGB 11.6* 14.3  HCT 34.9* 42.0  MCV 81.2 80.5  PLT 240 774   Basic Metabolic Panel:  Recent Labs Lab 07/10/17 1234 07/11/17 0539  NA 140 137  K 3.7 4.0  CL 103 97*  CO2 29 27  GLUCOSE 104* 110*  BUN 11 9  CREATININE 0.95 0.79  CALCIUM 8.8* 9.1   GFR: Estimated Creatinine Clearance: 94.7 mL/min (by C-G formula based on SCr of 0.79 mg/dL). Liver Function Tests:  Recent Labs Lab 07/11/17 0539  AST 23  ALT 15  ALKPHOS 102  BILITOT 0.7  PROT 8.3*  ALBUMIN 4.3   No results for input(s): LIPASE, AMYLASE in the last 168 hours. No results for input(s): AMMONIA in the last 168 hours. Coagulation Profile:  Recent Labs Lab 07/10/17 1234  INR 1.02   Cardiac Enzymes: No results for input(s): CKTOTAL, CKMB, CKMBINDEX, TROPONINI in the last 168 hours. BNP (last 3 results) No results for input(s): PROBNP in the last 8760 hours. HbA1C: No results for input(s): HGBA1C in the last 72 hours. CBG: No results for input(s): GLUCAP in the last 168 hours. Lipid Profile: No results for input(s): CHOL, HDL, LDLCALC,  TRIG, CHOLHDL, LDLDIRECT in the last 72 hours. Thyroid Function Tests: No results for input(s): TSH, T4TOTAL, FREET4, T3FREE, THYROIDAB in the last 72 hours. Anemia Panel: No results for input(s): VITAMINB12, FOLATE, FERRITIN, TIBC, IRON, RETICCTPCT in the last 72 hours. Sepsis Labs: No results for input(s): PROCALCITON, LATICACIDVEN in the last 168 hours.  No results found for this or any previous visit (from the past 240 hour(s)).   Radiology Studies: Ir Angiogram Pelvis Selective Or Supraselective  Result Date: 07/10/2017 INDICATION: 46 year old with uterine fibroids, menorrhagia and dysmenorrhea. EXAM: BILATERAL UTERINE ARTERY EMBOLIZATION MEDICATIONS: Ancef 2 g. The antibiotic was administered within 1 hour of the procedure. Zofran 4 mg. Toradol  30 mg IV. Lopressor 5 mg, Hydralazine 10 mg ANESTHESIA/SEDATION: Fentanyl 300 mcg IV; Versed 2 mg IV Moderate Sedation Time:  112 minutes The patient was continuously monitored during the procedure by the interventional radiology nurse under my direct supervision. CONTRAST:  55mL ISOVUE-300 IOPAMIDOL (ISOVUE-300) INJECTION 61% FLUOROSCOPY TIME:  Fluoroscopy Time: 36 minutes 36 seconds (1602 mGy). COMPLICATIONS: None immediate. PROCEDURE: Informed consent was obtained from the patient following explanation of the procedure, risks, benefits and alternatives. The patient understands, agrees and consents for the procedure. All questions were addressed. A time out was performed prior to the initiation of the procedure. Maximal barrier sterile technique utilized including caps, mask, sterile gowns, sterile gloves, large sterile drape, hand hygiene, and Betadine prep. The patient was placed supine on the interventional table. Ultrasound confirmed a patent right common femoral artery. The right groin was prepped and draped in a sterile fashion. Maximal barrier sterile technique was utilized including caps, mask, sterile gowns, sterile gloves, sterile drape, hand hygiene and skin antiseptic. The skin was anesthetized 1% lidocaine. Using ultrasound guidance, 21 gauge needle was directed in the right common femoral artery and a micropuncture dilator set was placed. The vascular access was upsized to a 5-French vascular sheath. A Cobra catheter was used to cannulate the left common iliac artery and the left internal iliac artery. A series of arteriograms were performed to identify the left uterine artery orfice. A Progreat microcatheter was advanced into the left uterine artery. 1.75 vials of Embospheres (500 - 700 micron) were injected through the microcatheter with fluoroscopic guidance. There was near complete stasis of the left uterine artery following administration of the particles. A Waltman's loop was formed using the  Cobra catheter but it is difficult to cannulate the right common iliac artery with this catheter. Therefore, a Mancel Bale uterine catheter was formed over the aortic bifurcation and used to cannulate the right common iliac artery and right internal iliac artery. The right uterine artery was identified with contrast angiograms. The microcatheter was advanced into the right uterine artery and a series of angiograms were performed. 2.25 vials of Embospheres (500-700 micron) were injected through the microcatheter with fluoroscopic guidance. There was near complete stasis of the right uterine artery at the end of the procedure. The microcatheter was removed. The Roberts catheter was straightened out over the aortic bifurcation and removed over a wire. An angiogram performed through the right groin sheath. The right groin sheath was removed with an Exoseal closure device. Hemostasis in the groin at the end of the procedure. Fluoroscopic and ultrasound images were taken and saved for documentation. FINDINGS: Large uterine arteries bilaterally. Near complete stasis of the uterine arteries at the end of the procedure. IMPRESSION: Successful uterine artery embolization procedure. Electronically Signed   By: Markus Daft M.D.   On: 07/10/2017 16:55  Ir Angiogram Pelvis Selective Or Supraselective  Result Date: 07/10/2017 INDICATION: 46 year old with uterine fibroids, menorrhagia and dysmenorrhea. EXAM: BILATERAL UTERINE ARTERY EMBOLIZATION MEDICATIONS: Ancef 2 g. The antibiotic was administered within 1 hour of the procedure. Zofran 4 mg. Toradol 30 mg IV. Lopressor 5 mg, Hydralazine 10 mg ANESTHESIA/SEDATION: Fentanyl 300 mcg IV; Versed 2 mg IV Moderate Sedation Time:  112 minutes The patient was continuously monitored during the procedure by the interventional radiology nurse under my direct supervision. CONTRAST:  63mL ISOVUE-300 IOPAMIDOL (ISOVUE-300) INJECTION 61% FLUOROSCOPY TIME:  Fluoroscopy Time: 36 minutes 36  seconds (1602 mGy). COMPLICATIONS: None immediate. PROCEDURE: Informed consent was obtained from the patient following explanation of the procedure, risks, benefits and alternatives. The patient understands, agrees and consents for the procedure. All questions were addressed. A time out was performed prior to the initiation of the procedure. Maximal barrier sterile technique utilized including caps, mask, sterile gowns, sterile gloves, large sterile drape, hand hygiene, and Betadine prep. The patient was placed supine on the interventional table. Ultrasound confirmed a patent right common femoral artery. The right groin was prepped and draped in a sterile fashion. Maximal barrier sterile technique was utilized including caps, mask, sterile gowns, sterile gloves, sterile drape, hand hygiene and skin antiseptic. The skin was anesthetized 1% lidocaine. Using ultrasound guidance, 21 gauge needle was directed in the right common femoral artery and a micropuncture dilator set was placed. The vascular access was upsized to a 5-French vascular sheath. A Cobra catheter was used to cannulate the left common iliac artery and the left internal iliac artery. A series of arteriograms were performed to identify the left uterine artery orfice. A Progreat microcatheter was advanced into the left uterine artery. 1.75 vials of Embospheres (500 - 700 micron) were injected through the microcatheter with fluoroscopic guidance. There was near complete stasis of the left uterine artery following administration of the particles. A Waltman's loop was formed using the Cobra catheter but it is difficult to cannulate the right common iliac artery with this catheter. Therefore, a Mancel Bale uterine catheter was formed over the aortic bifurcation and used to cannulate the right common iliac artery and right internal iliac artery. The right uterine artery was identified with contrast angiograms. The microcatheter was advanced into the right uterine  artery and a series of angiograms were performed. 2.25 vials of Embospheres (500-700 micron) were injected through the microcatheter with fluoroscopic guidance. There was near complete stasis of the right uterine artery at the end of the procedure. The microcatheter was removed. The Roberts catheter was straightened out over the aortic bifurcation and removed over a wire. An angiogram performed through the right groin sheath. The right groin sheath was removed with an Exoseal closure device. Hemostasis in the groin at the end of the procedure. Fluoroscopic and ultrasound images were taken and saved for documentation. FINDINGS: Large uterine arteries bilaterally. Near complete stasis of the uterine arteries at the end of the procedure. IMPRESSION: Successful uterine artery embolization procedure. Electronically Signed   By: Markus Daft M.D.   On: 07/10/2017 16:55   Ir Angiogram Selective Each Additional Vessel  Result Date: 07/10/2017 INDICATION: 46 year old with uterine fibroids, menorrhagia and dysmenorrhea. EXAM: BILATERAL UTERINE ARTERY EMBOLIZATION MEDICATIONS: Ancef 2 g. The antibiotic was administered within 1 hour of the procedure. Zofran 4 mg. Toradol 30 mg IV. Lopressor 5 mg, Hydralazine 10 mg ANESTHESIA/SEDATION: Fentanyl 300 mcg IV; Versed 2 mg IV Moderate Sedation Time:  112 minutes The patient was continuously monitored during the procedure by  the interventional radiology nurse under my direct supervision. CONTRAST:  64mL ISOVUE-300 IOPAMIDOL (ISOVUE-300) INJECTION 61% FLUOROSCOPY TIME:  Fluoroscopy Time: 36 minutes 36 seconds (1602 mGy). COMPLICATIONS: None immediate. PROCEDURE: Informed consent was obtained from the patient following explanation of the procedure, risks, benefits and alternatives. The patient understands, agrees and consents for the procedure. All questions were addressed. A time out was performed prior to the initiation of the procedure. Maximal barrier sterile technique utilized  including caps, mask, sterile gowns, sterile gloves, large sterile drape, hand hygiene, and Betadine prep. The patient was placed supine on the interventional table. Ultrasound confirmed a patent right common femoral artery. The right groin was prepped and draped in a sterile fashion. Maximal barrier sterile technique was utilized including caps, mask, sterile gowns, sterile gloves, sterile drape, hand hygiene and skin antiseptic. The skin was anesthetized 1% lidocaine. Using ultrasound guidance, 21 gauge needle was directed in the right common femoral artery and a micropuncture dilator set was placed. The vascular access was upsized to a 5-French vascular sheath. A Cobra catheter was used to cannulate the left common iliac artery and the left internal iliac artery. A series of arteriograms were performed to identify the left uterine artery orfice. A Progreat microcatheter was advanced into the left uterine artery. 1.75 vials of Embospheres (500 - 700 micron) were injected through the microcatheter with fluoroscopic guidance. There was near complete stasis of the left uterine artery following administration of the particles. A Waltman's loop was formed using the Cobra catheter but it is difficult to cannulate the right common iliac artery with this catheter. Therefore, a Mancel Bale uterine catheter was formed over the aortic bifurcation and used to cannulate the right common iliac artery and right internal iliac artery. The right uterine artery was identified with contrast angiograms. The microcatheter was advanced into the right uterine artery and a series of angiograms were performed. 2.25 vials of Embospheres (500-700 micron) were injected through the microcatheter with fluoroscopic guidance. There was near complete stasis of the right uterine artery at the end of the procedure. The microcatheter was removed. The Roberts catheter was straightened out over the aortic bifurcation and removed over a wire. An angiogram  performed through the right groin sheath. The right groin sheath was removed with an Exoseal closure device. Hemostasis in the groin at the end of the procedure. Fluoroscopic and ultrasound images were taken and saved for documentation. FINDINGS: Large uterine arteries bilaterally. Near complete stasis of the uterine arteries at the end of the procedure. IMPRESSION: Successful uterine artery embolization procedure. Electronically Signed   By: Markus Daft M.D.   On: 07/10/2017 16:55   Ir Angiogram Selective Each Additional Vessel  Result Date: 07/10/2017 Markus Daft, MD     07/10/2017  4:05 PM Pre-operative Diagnosis: Fibroids    Post-operative Diagnosis: Enlarged uterus and fibroids Indications: Menorrhagia Procedure: Bilateral uterine artery embolization Findings: Enlarged uterus.  Successful embolization of bilateral uterine arteries. Complications: None    EBL: Minimal Plan: Keep overnight for observation and pain control.    Ir US Guide Vasc Access Right  Result Date: 07/10/2017 INDICATION: 46 year old with uterine fibroids, menorrhagia and dysmenorrhea. EXAM: BILATERAL UTERINE ARTERY EMBOLIZATION MEDICATIONS: Ancef 2 g. The antibiotic was administered within 1 hour of the procedure. Zofran 4 mg. Toradol 30 mg IV. Lopressor 5 mg, Hydralazine 10 mg ANESTHESIA/SEDATION: Fentanyl 300 mcg IV; Versed 2 mg IV Moderate Sedation Time:  112 minutes The patient was continuously monitored during the procedure by the interventional radiology nurse under my  direct supervision. CONTRAST:  18mL ISOVUE-300 IOPAMIDOL (ISOVUE-300) INJECTION 61% FLUOROSCOPY TIME:  Fluoroscopy Time: 36 minutes 36 seconds (1602 mGy). COMPLICATIONS: None immediate. PROCEDURE: Informed consent was obtained from the patient following explanation of the procedure, risks, benefits and alternatives. The patient understands, agrees and consents for the procedure. All questions were addressed. A time out was performed prior to the initiation of the  procedure. Maximal barrier sterile technique utilized including caps, mask, sterile gowns, sterile gloves, large sterile drape, hand hygiene, and Betadine prep. The patient was placed supine on the interventional table. Ultrasound confirmed a patent right common femoral artery. The right groin was prepped and draped in a sterile fashion. Maximal barrier sterile technique was utilized including caps, mask, sterile gowns, sterile gloves, sterile drape, hand hygiene and skin antiseptic. The skin was anesthetized 1% lidocaine. Using ultrasound guidance, 21 gauge needle was directed in the right common femoral artery and a micropuncture dilator set was placed. The vascular access was upsized to a 5-French vascular sheath. A Cobra catheter was used to cannulate the left common iliac artery and the left internal iliac artery. A series of arteriograms were performed to identify the left uterine artery orfice. A Progreat microcatheter was advanced into the left uterine artery. 1.75 vials of Embospheres (500 - 700 micron) were injected through the microcatheter with fluoroscopic guidance. There was near complete stasis of the left uterine artery following administration of the particles. A Waltman's loop was formed using the Cobra catheter but it is difficult to cannulate the right common iliac artery with this catheter. Therefore, a Mancel Bale uterine catheter was formed over the aortic bifurcation and used to cannulate the right common iliac artery and right internal iliac artery. The right uterine artery was identified with contrast angiograms. The microcatheter was advanced into the right uterine artery and a series of angiograms were performed. 2.25 vials of Embospheres (500-700 micron) were injected through the microcatheter with fluoroscopic guidance. There was near complete stasis of the right uterine artery at the end of the procedure. The microcatheter was removed. The Roberts catheter was straightened out over the  aortic bifurcation and removed over a wire. An angiogram performed through the right groin sheath. The right groin sheath was removed with an Exoseal closure device. Hemostasis in the groin at the end of the procedure. Fluoroscopic and ultrasound images were taken and saved for documentation. FINDINGS: Large uterine arteries bilaterally. Near complete stasis of the uterine arteries at the end of the procedure. IMPRESSION: Successful uterine artery embolization procedure. Electronically Signed   By: Markus Daft M.D.   On: 07/10/2017 16:55   Ir Embo Tumor Organ Ischemia Infarct Inc Guide Roadmapping  Result Date: 07/10/2017 INDICATION: 46 year old with uterine fibroids, menorrhagia and dysmenorrhea. EXAM: BILATERAL UTERINE ARTERY EMBOLIZATION MEDICATIONS: Ancef 2 g. The antibiotic was administered within 1 hour of the procedure. Zofran 4 mg. Toradol 30 mg IV. Lopressor 5 mg, Hydralazine 10 mg ANESTHESIA/SEDATION: Fentanyl 300 mcg IV; Versed 2 mg IV Moderate Sedation Time:  112 minutes The patient was continuously monitored during the procedure by the interventional radiology nurse under my direct supervision. CONTRAST:  8mL ISOVUE-300 IOPAMIDOL (ISOVUE-300) INJECTION 61% FLUOROSCOPY TIME:  Fluoroscopy Time: 36 minutes 36 seconds (1602 mGy). COMPLICATIONS: None immediate. PROCEDURE: Informed consent was obtained from the patient following explanation of the procedure, risks, benefits and alternatives. The patient understands, agrees and consents for the procedure. All questions were addressed. A time out was performed prior to the initiation of the procedure. Maximal barrier sterile technique utilized  including caps, mask, sterile gowns, sterile gloves, large sterile drape, hand hygiene, and Betadine prep. The patient was placed supine on the interventional table. Ultrasound confirmed a patent right common femoral artery. The right groin was prepped and draped in a sterile fashion. Maximal barrier sterile technique  was utilized including caps, mask, sterile gowns, sterile gloves, sterile drape, hand hygiene and skin antiseptic. The skin was anesthetized 1% lidocaine. Using ultrasound guidance, 21 gauge needle was directed in the right common femoral artery and a micropuncture dilator set was placed. The vascular access was upsized to a 5-French vascular sheath. A Cobra catheter was used to cannulate the left common iliac artery and the left internal iliac artery. A series of arteriograms were performed to identify the left uterine artery orfice. A Progreat microcatheter was advanced into the left uterine artery. 1.75 vials of Embospheres (500 - 700 micron) were injected through the microcatheter with fluoroscopic guidance. There was near complete stasis of the left uterine artery following administration of the particles. A Waltman's loop was formed using the Cobra catheter but it is difficult to cannulate the right common iliac artery with this catheter. Therefore, a Mancel Bale uterine catheter was formed over the aortic bifurcation and used to cannulate the right common iliac artery and right internal iliac artery. The right uterine artery was identified with contrast angiograms. The microcatheter was advanced into the right uterine artery and a series of angiograms were performed. 2.25 vials of Embospheres (500-700 micron) were injected through the microcatheter with fluoroscopic guidance. There was near complete stasis of the right uterine artery at the end of the procedure. The microcatheter was removed. The Roberts catheter was straightened out over the aortic bifurcation and removed over a wire. An angiogram performed through the right groin sheath. The right groin sheath was removed with an Exoseal closure device. Hemostasis in the groin at the end of the procedure. Fluoroscopic and ultrasound images were taken and saved for documentation. FINDINGS: Large uterine arteries bilaterally. Near complete stasis of the uterine  arteries at the end of the procedure. IMPRESSION: Successful uterine artery embolization procedure. Electronically Signed   By: Markus Daft M.D.   On: 07/10/2017 16:55    Scheduled Meds: . amLODipine  10 mg Oral Daily  . docusate sodium  100 mg Oral BID  . feeding supplement (ENSURE ENLIVE)  237 mL Oral BID BM  . ferrous sulfate  325 mg Oral Q breakfast  . hydrALAZINE  25 mg Oral Q8H  . hydrochlorothiazide  25 mg Oral Daily  . ketorolac  30 mg Intravenous Q6H  . sodium chloride flush  3 mL Intravenous Q12H   Continuous Infusions: . sodium chloride 250 mL (07/10/17 1849)     LOS: 0 days   Ransome Helwig, Orpah Melter, MD [facility] Pager [phone]  If 7PM-7AM, please contact night-coverage [email or web address] Password [email or web address] 07/11/2017, 6:30 PM

## 2017-07-31 ENCOUNTER — Ambulatory Visit
Admission: RE | Admit: 2017-07-31 | Discharge: 2017-07-31 | Disposition: A | Discharge status: Home / Self Care | Payer: 59 | Source: Ambulatory Visit | Attending: Radiology | Admitting: Radiology

## 2017-07-31 DIAGNOSIS — D259 Leiomyoma of uterus, unspecified: Secondary | ICD-10-CM

## 2017-07-31 HISTORY — PX: IR RADIOLOGIST EVAL & MGMT: IMG5224

## 2017-07-31 NOTE — Progress Notes (Signed)
Referring Physician(s):  Dr. Leo Grosser  Chief Complaint: The patient is seen in follow up today s/p uterine artery embolization 8/30  History of present illness: Alice Flores is a 46 year old woman with a history of HTN, low back pain, anemia, and symptomatic uterine fibroids.  She was seen in consultation with Dr. Anselm Pancoast 04/22/17 and elected to proceed with uterine artery embolization.  Patient underwent successful procedure 07/10/17.  At the time of procedure she was found to have elevated blood pressure. She had expected pain post-procedure and responded well to pain medication.  Hospitalist service provided assistance with blood pressure control.  She was discharged home with ibuprofen, Norco, colace, zofran, hydralazine, and clonidine.  Patient presents to clinic today for follow-up.  She states she has been feeling well and overall is very pleased with her results.   She states her initial cramps were well-controlled with ibuprofen.  She denies fever, chills, vaginal discharge, cramping, intermittent bleeding.  She did have her first regular cycle post-procedure last week and states her bleeding and craming was significantly improved. She used pads only for her cycle which lasted 4 days. This was a significant change from her heavy cycle before the procedure which required multiple tampons and pads daily, and lasted 8 days. She also has reduced sense of bulkiness and bloating.  She has seen her PCP for ongoing assessment of her blood pressure.    Past Medical History:  Diagnosis Date  . Anemia   . Bacterial vaginosis 12/06/10  . Candida vaginitis 05/1998  . Chlamydia 01/1993  . Dysplasia of cervix, low grade (CIN 1)    with negative ECC  . H/O dysmenorrhea 2013  . H/O fatigue   . H/O menorrhagia 12/06/10  . History of HPV infection   . History of UTI 05/1998  . Hx of urinary frequency 01/02/12  . Hypertension   . Lower back pain 12/10/2007  . Uterine fibroid 2010  . Yeast infection      Past Surgical History:  Procedure Laterality Date  . history of biopsy     05/2017  . IR ANGIOGRAM PELVIS SELECTIVE OR SUPRASELECTIVE  07/10/2017  . IR ANGIOGRAM PELVIS SELECTIVE OR SUPRASELECTIVE  07/10/2017  . IR ANGIOGRAM SELECTIVE EACH ADDITIONAL VESSEL  07/10/2017  . IR ANGIOGRAM SELECTIVE EACH ADDITIONAL VESSEL  07/10/2017  . IR EMBO TUMOR ORGAN ISCHEMIA INFARCT INC GUIDE ROADMAPPING  07/10/2017  . IR RADIOLOGIST EVAL & MGMT  04/22/2017  . IR US GUIDE VASC ACCESS RIGHT  07/10/2017  . TONSILLECTOMY      Allergies: Naprosyn [naproxen]  Medications: Prior to Admission medications   Medication Sig Start Date End Date Taking? Authorizing Provider  amLODipine (NORVASC) 10 MG tablet Take 10 mg by mouth daily.    [provider]  clonazePAM (KLONOPIN) 0.5 MG tablet Take 1 tablet by mouth daily as needed for anxiety    [provider]  cyclobenzaprine (FLEXERIL) 5 MG tablet Take 1 tablet by mouth daily as needed for muscle spasms    [provider]  ferrous sulfate 325 (65 FE) MG tablet Take 325 mg by mouth daily with breakfast.    [provider]  hydrALAZINE (APRESOLINE) 25 MG tablet Take 1 tablet (25 mg total) by mouth every 8 (eight) hours. 07/11/17   Donne Hazel, MD  hydrochlorothiazide (HYDRODIURIL) 25 MG tablet Take 25 mg by mouth daily.    [provider]  medroxyPROGESTERone (PROVERA) 10 MG tablet Take 1 tablet by mouth daily  [provider]  potassium chloride SA (KLOR-CON M20) 20 MEQ tablet Take 1 tablet by mouth daily    [provider]     Family History  Problem Relation Age of Onset  . Diabetes Maternal Grandmother   . Heart disease Maternal Grandmother   . Diabetes Mother   . Hypertension Mother   . Diabetes Sister   . Tuberculosis Father     Social History   Social History  . Marital status: Single    Spouse name: N/A  . Number of children: N/A  . Years of education: N/A   Social History  Main Topics  . Smoking status: Former Research scientist (life sciences)  . Smokeless tobacco: Never Used     Comment: long time ago  . Alcohol use Yes     Comment: socially  . Drug use: No  . Sexual activity: Yes    Partners: Male    Birth control/ protection: Abstinence   Other Topics Concern  . Not on file   Social History Narrative  . No narrative on file     Vital Signs: BP (!) 159/96   Pulse 85   Temp 98 F (36.7 C) (Oral)   Resp 15   LMP 07/22/2017 (Approximate)   SpO2 97%   Physical Exam  Nursing note and vitals reviewed. NAD, alert Heart:  Regular rate and rhythm Resp:  Clear to auscultation, no increased work of breathing Abdomen: soft, non-tender Skin: groin site intact with small scar, soft Vascular:  Distal pulses intact, 2+ bilaterally  Imaging: No results found.  Labs:  CBC:  Recent Labs  07/10/17 1234 07/11/17 0539  WBC 6.3 10.0  HGB 11.6* 14.3  HCT 34.9* 42.0  PLT 240 247    COAGS:  Recent Labs  07/10/17 1234  INR 1.02  APTT 33    BMP:  Recent Labs  04/29/17 0839 07/10/17 1234 07/11/17 0539  NA  --  140 137  K  --  3.7 4.0  CL  --  103 97*  CO2  --  29 27  GLUCOSE  --  104* 110*  BUN  --  11 9  CALCIUM  --  8.8* 9.1  CREATININE 0.90 0.95 0.79  GFRNONAA  --  >60 >60  GFRAA  --  >60 >60    LIVER FUNCTION TESTS:  Recent Labs  07/11/17 0539  BILITOT 0.7  AST 23  ALT 15  ALKPHOS 102  PROT 8.3*  ALBUMIN 4.3    Assessment: Patient with history of symptomatic uterine fibroids s/p uterine artery embolization procedure 07/10/17.  She presents to clinic today for follow-up.  She denies complaints and does not have any concerns.  Overall she is very pleased with her results thus far including less cramping, feelings of fullness, and lighter menstrual flow.  Will plan to see her in follow-up with MRI in approximately 6 months.  Patient verbalizes understanding.    Signed: Docia Barrier, PA 07/31/2017, 9:02 AM   Please refer  to Dr. Anselm Pancoast attestation of this note for management and plan.

## 2017-08-21 ENCOUNTER — Encounter (HOSPITAL_COMMUNITY): Payer: Self-pay | Admitting: Diagnostic Radiology

## 2017-08-25 ENCOUNTER — Encounter: Payer: Self-pay | Admitting: Diagnostic Radiology

## 2017-09-23 DIAGNOSIS — D649 Anemia, unspecified: Secondary | ICD-10-CM | POA: Diagnosis not present

## 2017-10-07 ENCOUNTER — Other Ambulatory Visit (HOSPITAL_COMMUNITY): Payer: Self-pay | Admitting: Diagnostic Radiology

## 2017-10-07 DIAGNOSIS — D219 Benign neoplasm of connective and other soft tissue, unspecified: Secondary | ICD-10-CM

## 2017-10-16 ENCOUNTER — Encounter: Payer: Self-pay | Admitting: Radiology

## 2017-10-16 ENCOUNTER — Ambulatory Visit
Admission: RE | Admit: 2017-10-16 | Discharge: 2017-10-16 | Disposition: A | Discharge status: Home / Self Care | Payer: 59 | Source: Ambulatory Visit | Attending: Diagnostic Radiology | Admitting: Diagnostic Radiology

## 2017-10-16 DIAGNOSIS — Z9889 Other specified postprocedural states: Secondary | ICD-10-CM | POA: Diagnosis not present

## 2017-10-16 DIAGNOSIS — D219 Benign neoplasm of connective and other soft tissue, unspecified: Secondary | ICD-10-CM

## 2017-10-16 HISTORY — PX: IR RADIOLOGIST EVAL & MGMT: IMG5224

## 2017-10-16 NOTE — Consult Note (Signed)
Chief Complaint: Patient was seen in consultation today for vaginal spotting after uterine artery embolization.  Referring Physician(s): Kendall Flack  History of Present Illness: Alice Flores is a 46 y.o. female with history of symptomatic uterine fibroids. Patient underwent uterine artery embolization on 07/10/2017. Overall, the patient has done well following the procedure. Her menstrual periods are a little irregular compared to the pretreatment periods. The menstrual bleeding has been much lighter but she has light spotting all of the time and requiring a pad. She denies any fevers or chills.  Occasionally the vaginal spotting has been foul-smelling but this is not constant. She is currently having a light without abnormal discharge. Patient is concerned about the persistent vaginal spotting following the procedure. She also has intermittent cramping in the lower back region. Patient is being treated for hypertension but she did not take her blood pressure medicine yet today.  Past Medical History:  Diagnosis Date  . Anemia   . Bacterial vaginosis 12/06/10  . Candida vaginitis 05/1998  . Chlamydia 01/1993  . Dysplasia of cervix, low grade (CIN 1)    with negative ECC  . H/O dysmenorrhea 2013  . H/O fatigue   . H/O menorrhagia 12/06/10  . History of HPV infection   . History of UTI 05/1998  . Hx of urinary frequency 01/02/12  . Hypertension   . Lower back pain 12/10/2007  . Uterine fibroid 2010  . Yeast infection     Past Surgical History:  Procedure Laterality Date  . history of biopsy     05/2017  . IR ANGIOGRAM PELVIS SELECTIVE OR SUPRASELECTIVE  07/10/2017  . IR ANGIOGRAM PELVIS SELECTIVE OR SUPRASELECTIVE  07/10/2017  . IR ANGIOGRAM SELECTIVE EACH ADDITIONAL VESSEL  07/10/2017  . IR ANGIOGRAM SELECTIVE EACH ADDITIONAL VESSEL  07/10/2017  . IR EMBO TUMOR ORGAN ISCHEMIA INFARCT INC GUIDE ROADMAPPING  07/10/2017  . IR RADIOLOGIST EVAL & MGMT  04/22/2017  . IR RADIOLOGIST  EVAL & MGMT  07/31/2017  . IR RADIOLOGIST EVAL & MGMT  10/16/2017  . IR US GUIDE VASC ACCESS RIGHT  07/10/2017  . TONSILLECTOMY      Allergies: Naprosyn [naproxen]  Medications: Prior to Admission medications   Medication Sig Start Date End Date Taking? Authorizing Provider  amLODipine (NORVASC) 10 MG tablet Take 10 mg by mouth daily.   Yes [provider]  clonazePAM (KLONOPIN) 0.5 MG tablet Take 1 tablet by mouth daily as needed for anxiety   Yes [provider]  cyclobenzaprine (FLEXERIL) 5 MG tablet Take 1 tablet by mouth daily as needed for muscle spasms   Yes [provider]  ferrous sulfate 325 (65 FE) MG tablet Take 325 mg by mouth daily with breakfast.   Yes [provider]  hydrALAZINE (APRESOLINE) 25 MG tablet Take 1 tablet (25 mg total) by mouth every 8 (eight) hours. 07/11/17  Yes Donne Hazel, MD  hydrochlorothiazide (HYDRODIURIL) 25 MG tablet Take 25 mg by mouth daily.   Yes [provider]  potassium chloride SA (KLOR-CON M20) 20 MEQ tablet Take 1 tablet by mouth daily   Yes [provider]  medroxyPROGESTERone (PROVERA) 10 MG tablet Take 1 tablet by mouth daily    [provider]     Family History  Problem Relation Age of Onset  . Diabetes Maternal Grandmother   . Heart disease Maternal Grandmother   . Diabetes Mother   . Hypertension Mother   . Diabetes Sister   . Tuberculosis Father  Social History   Socioeconomic History  . Marital status: Single    Spouse name: Not on file  . Number of children: Not on file  . Years of education: Not on file  . Highest education level: Not on file  Social Needs  . Financial resource strain: Not on file  . Food insecurity - worry: Not on file  . Food insecurity - inability: Not on file  . Transportation needs - medical: Not on file  . Transportation needs - non-medical: Not on file  Occupational History  . Not on file  Tobacco Use  . Smoking status:  Former Research scientist (life sciences)  . Smokeless tobacco: Never Used  . Tobacco comment: long time ago  Substance and Sexual Activity  . Alcohol use: Yes    Comment: socially  . Drug use: No  . Sexual activity: Yes    Partners: Male    Birth control/protection: Abstinence  Other Topics Concern  . Not on file  Social History Narrative  . Not on file     Review of Systems  Constitutional: Negative for chills and fever.  Respiratory: Negative.   Cardiovascular: Negative.   Gastrointestinal: Negative.   Genitourinary: Positive for pelvic pain, vaginal bleeding and vaginal discharge.    Vital Signs: BP (!) 163/101   Pulse 89   Temp 98.6 F (37 C) (Oral)   Resp 15   Ht 5\' 1"  (1.549 m)   Wt 214 lb 8 oz (97.3 kg)   LMP 10/10/2017   SpO2 99%   BMI 40.53 kg/m   Physical Exam  Constitutional: No distress.  Cardiovascular: Normal rate, regular rhythm and normal heart sounds.  Pulmonary/Chest: Effort normal and breath sounds normal.  Abdominal: Bowel sounds are normal. There is no rebound and no guarding.  Mild tenderness in the anterior pelvic region.    Imaging: Ir Radiologist Eval & Mgmt  Result Date: 10/16/2017 Please refer to notes tab for details about interventional procedure. (Op Note)   Labs:  CBC: Recent Labs    07/10/17 1234 07/11/17 0539  WBC 6.3 10.0  HGB 11.6* 14.3  HCT 34.9* 42.0  PLT 240 247    COAGS: Recent Labs    07/10/17 1234  INR 1.02  APTT 33    BMP: Recent Labs    04/29/17 0839 07/10/17 1234 07/11/17 0539  NA  --  140 137  K  --  3.7 4.0  CL  --  103 97*  CO2  --  29 27  GLUCOSE  --  104* 110*  BUN  --  11 9  CALCIUM  --  8.8* 9.1  CREATININE 0.90 0.95 0.79  GFRNONAA  --  >60 >60  GFRAA  --  >60 >60    LIVER FUNCTION TESTS: Recent Labs    07/11/17 0539  BILITOT 0.7  AST 23  ALT 15  ALKPHOS 102  PROT 8.3*  ALBUMIN 4.3    TUMOR MARKERS: No results for input(s): AFPTM, CEA, CA199, CHROMGRNA in the last 8760 hours.  Assessment  and Plan:   46 year old with history of menorrhagia and dysmenorrhea secondary to uterine fibroids. Uterine artery embolization procedure was successfully performed on 07/10/2017. Overall, the patient has done well following the procedure. The menstrual bleeding is much lighter since the procedure. Patient continues to complain of abdominal cramping and persistent vaginal spotting. Based on exam and history, I do not think the patient has an infection. I reassured the patient that the spotting can be normal following this procedure and  can last for months. Explained that fibroid degeneration can also cause vaginal discharge following the procedure. Patient is scheduled to follow-up with her gynecologist in approximately one month. Patient will inform us if she has foul-smelling vaginal discharge in the future or signs of infection. Otherwise, plan to see the patient back for her 6 month follow-up with MRI.   Thank you for this interesting consult.  I greatly enjoyed meeting Treacy Holcomb and look forward to participating in their care.  A copy of this report was sent to the requesting provider on this date.  Electronically Signed: Burman Riis 10/16/2017, 4:09 PM   I spent a total of    15 Minutes in face to face in clinical consultation, greater than 50% of which was counseling/coordinating care for post uterine artery embolization.

## 2017-12-25 ENCOUNTER — Other Ambulatory Visit: Payer: Self-pay | Admitting: Radiology

## 2017-12-25 ENCOUNTER — Other Ambulatory Visit (HOSPITAL_COMMUNITY): Payer: Self-pay | Admitting: Diagnostic Radiology

## 2017-12-25 DIAGNOSIS — D219 Benign neoplasm of connective and other soft tissue, unspecified: Secondary | ICD-10-CM

## 2017-12-30 ENCOUNTER — Other Ambulatory Visit (HOSPITAL_COMMUNITY): Payer: Self-pay | Admitting: Diagnostic Radiology

## 2018-01-01 ENCOUNTER — Ambulatory Visit (HOSPITAL_COMMUNITY)
Admission: RE | Admit: 2018-01-01 | Discharge: 2018-01-01 | Disposition: A | Discharge status: Home / Self Care | Payer: 59 | Source: Ambulatory Visit | Attending: Diagnostic Radiology | Admitting: Diagnostic Radiology

## 2018-01-01 DIAGNOSIS — D259 Leiomyoma of uterus, unspecified: Secondary | ICD-10-CM | POA: Insufficient documentation

## 2018-01-01 DIAGNOSIS — D219 Benign neoplasm of connective and other soft tissue, unspecified: Secondary | ICD-10-CM | POA: Diagnosis not present

## 2018-01-01 DIAGNOSIS — Z9889 Other specified postprocedural states: Secondary | ICD-10-CM | POA: Insufficient documentation

## 2018-01-01 LAB — POCT I-STAT CREATININE: CREATININE: 1.1 mg/dL — AB (ref 0.44–1.00)

## 2018-01-01 MED ORDER — GADOBENATE DIMEGLUMINE 529 MG/ML IV SOLN
20.0000 mL | Freq: Once | INTRAVENOUS | Status: AC | PRN
  Administered 2018-01-01: 20 mL via INTRAVENOUS

## 2018-01-08 DIAGNOSIS — M62838 Other muscle spasm: Secondary | ICD-10-CM | POA: Diagnosis not present

## 2018-01-08 DIAGNOSIS — I10 Essential (primary) hypertension: Secondary | ICD-10-CM | POA: Diagnosis not present

## 2018-01-08 DIAGNOSIS — D649 Anemia, unspecified: Secondary | ICD-10-CM | POA: Diagnosis not present

## 2018-01-13 ENCOUNTER — Ambulatory Visit
Admission: RE | Admit: 2018-01-13 | Discharge: 2018-01-13 | Disposition: A | Discharge status: Home / Self Care | Payer: 59 | Source: Ambulatory Visit | Attending: Diagnostic Radiology | Admitting: Diagnostic Radiology

## 2018-01-13 ENCOUNTER — Encounter: Payer: Self-pay | Admitting: *Deleted

## 2018-01-13 DIAGNOSIS — D219 Benign neoplasm of connective and other soft tissue, unspecified: Secondary | ICD-10-CM

## 2018-01-13 HISTORY — PX: IR RADIOLOGIST EVAL & MGMT: IMG5224

## 2018-01-13 NOTE — Progress Notes (Signed)
LMP: 12/26/2017.  Frequency:  Every month.  Length 5-7 days.  Light flow without clots.  Minimal cramping.    Bulk symptoms have resolved.

## 2018-01-13 NOTE — Progress Notes (Signed)
Chief Complaint: Patient was seen in consultation today for  Chief Complaint  Patient presents with  . Fibroids    6 mo follow up [place]     at the request of Rheda Kassab  Referring Physician(s): Kendall Flack  History of Present Illness: Alice Flores is a 47 y.o. female with history of uterine fibroids, menorrhagia and dysmenorrhea. The patient underwent bilateral uterine artery embolization on 07/10/2017. She presents for her 6 month follow-up. Overall, the patient is very happy with the results of the embolization procedure. Her menstrual bleeding is much lighter than it was prior to the procedure. The bleeding lasts 5-7 days without any heavy days of bleeding. She no longer has the cramps associated with her menstrual bleeding. She has decreased fullness in her anterior abdomen and she is able to lie on her stomach now. She does not complain of any vaginal discharge or discomfort. Patient is having some difficulty with her blood pressure. Patient is hypertensive today and did not take her medication last night. Patient recently saw her primary care doctor who is managing and changing her antihypertensive medications. She feels that some of the hypertension is related to work stress.  Past Medical History:  Diagnosis Date  . Anemia   . Bacterial vaginosis 12/06/10  . Candida vaginitis 05/1998  . Chlamydia 01/1993  . Dysplasia of cervix, low grade (CIN 1)    with negative ECC  . H/O dysmenorrhea 2013  . H/O fatigue   . H/O menorrhagia 12/06/10  . History of HPV infection   . History of UTI 05/1998  . Hx of urinary frequency 01/02/12  . Hypertension   . Lower back pain 12/10/2007  . Uterine fibroid 2010  . Yeast infection     Past Surgical History:  Procedure Laterality Date  . history of biopsy     05/2017  . IR ANGIOGRAM PELVIS SELECTIVE OR SUPRASELECTIVE  07/10/2017  . IR ANGIOGRAM PELVIS SELECTIVE OR SUPRASELECTIVE  07/10/2017  . IR ANGIOGRAM SELECTIVE EACH ADDITIONAL  VESSEL  07/10/2017  . IR ANGIOGRAM SELECTIVE EACH ADDITIONAL VESSEL  07/10/2017  . IR EMBO TUMOR ORGAN ISCHEMIA INFARCT INC GUIDE ROADMAPPING  07/10/2017  . IR RADIOLOGIST EVAL & MGMT  04/22/2017  . IR RADIOLOGIST EVAL & MGMT  07/31/2017  . IR RADIOLOGIST EVAL & MGMT  10/16/2017  . IR RADIOLOGIST EVAL & MGMT  01/13/2018  . IR US GUIDE VASC ACCESS RIGHT  07/10/2017  . TONSILLECTOMY      Allergies: Naprosyn [naproxen]  Medications: Prior to Admission medications   Medication Sig Start Date End Date Taking? Authorizing Provider  amLODipine (NORVASC) 10 MG tablet Take 10 mg by mouth daily.   Yes [provider]  clonazePAM (KLONOPIN) 0.5 MG tablet Take 1 tablet by mouth daily as needed for anxiety   Yes [provider]  cyclobenzaprine (FLEXERIL) 5 MG tablet Take 1 tablet by mouth daily as needed for muscle spasms   Yes [provider]  ferrous sulfate 325 (65 FE) MG tablet Take 325 mg by mouth daily with breakfast.   Yes [provider]  hydrALAZINE (APRESOLINE) 25 MG tablet Take 1 tablet (25 mg total) by mouth every 8 (eight) hours. 07/11/17  Yes Donne Hazel, MD  hydrochlorothiazide (HYDRODIURIL) 25 MG tablet Take 25 mg by mouth daily.   Yes [provider]  potassium chloride SA (KLOR-CON M20) 20 MEQ tablet Take 1 tablet by mouth daily   Yes [provider]  medroxyPROGESTERone (PROVERA) 10 MG  tablet Take 1 tablet by mouth daily    [provider]     Family History  Problem Relation Age of Onset  . Diabetes Maternal Grandmother   . Heart disease Maternal Grandmother   . Diabetes Mother   . Hypertension Mother   . Diabetes Sister   . Tuberculosis Father     Social History   Socioeconomic History  . Marital status: Single    Spouse name: Not on file  . Number of children: Not on file  . Years of education: Not on file  . Highest education level: Not on file  Social Needs  . Financial resource strain: Not on file  .  Food insecurity - worry: Not on file  . Food insecurity - inability: Not on file  . Transportation needs - medical: Not on file  . Transportation needs - non-medical: Not on file  Occupational History  . Not on file  Tobacco Use  . Smoking status: Former Research scientist (life sciences)  . Smokeless tobacco: Never Used  . Tobacco comment: long time ago  Substance and Sexual Activity  . Alcohol use: Yes    Comment: socially  . Drug use: No  . Sexual activity: Yes    Partners: Male    Birth control/protection: Abstinence  Other Topics Concern  . Not on file  Social History Narrative  . Not on file     Review of Systems  Constitutional: Negative.   Genitourinary: Negative.     Vital Signs: BP (!) 191/107   Pulse 97   Temp 99.1 F (37.3 C) (Oral)   Resp 15   Ht 5' 1.5" (1.562 m)   Wt 214 lb (97.1 kg)   LMP 12/26/2017 (Exact Date)   SpO2 98%   BMI 39.78 kg/m   Physical Exam  Constitutional: No distress.    Imaging: Mr Pelvis W Wo Contrast  Result Date: 01/01/2018 CLINICAL DATA:  Followup uterine fibroids. Six months status post uterine artery embolization. EXAM: MRI PELVIS WITHOUT AND WITH CONTRAST TECHNIQUE: Multiplanar multisequence MR imaging of the pelvis was performed both before and after administration of intravenous contrast. CONTRAST:  9mL MULTIHANCE GADOBENATE DIMEGLUMINE 529 MG/ML IV SOLN COMPARISON:  04/29/2017 FINDINGS: Urinary Tract: No bladder or urethral abnormality identified. Bowel:  Unremarkable visualized pelvic bowel loops. Vascular/Lymphatic: No pathologically enlarged lymph nodes or other significant abnormality. Reproductive: -- Uterus: Measures 15.9 x 8.2 x 9.4 cm (volume = 640 cm^3). This is decreased in size from estimated volume of 1,089 mL on prior study. Multiple uterine fibroids are again seen, with decreased size of individual fibroids since prior study. Largest fibroid in the anterior upper corpus and fundus measures 8.8 cm compared to 9.8 cm previously. Fibroid in  lower uterine segment measures 8.2 cm compared to 9.2 cm previously. -- Intracavitary fibroids:  None. -- Pedunculated fibroids: None. -- Fibroid contrast enhancement: Fibroids no longer show evidence of contrast enhancement, consistent with successful devascularization. -- Right ovary:  Appears normal.  No mass identified. -- Left ovary:  Appears normal.  No mass identified. Other: No abnormal free fluid. Musculoskeletal:  Unremarkable. IMPRESSION: Interval decrease in overall uterine volume and size of individual fibroids, consistent with interval embolization. No new or enlarging uterine masses. Normal appearance of both ovaries. Electronically Signed   By: Earle Gell M.D.   On: 01/01/2018 13:52   Ir Radiologist Eval & Mgmt  Result Date: 01/13/2018 Please refer to notes tab for details about interventional procedure. (Op Note)   Labs:  CBC: Recent Labs  07/10/17 1234 07/11/17 0539  WBC 6.3 10.0  HGB 11.6* 14.3  HCT 34.9* 42.0  PLT 240 247    COAGS: Recent Labs    07/10/17 1234  INR 1.02  APTT 33    BMP: Recent Labs    04/29/17 0839 07/10/17 1234 07/11/17 0539 01/01/18 1236  NA  --  140 137  --   K  --  3.7 4.0  --   CL  --  103 97*  --   CO2  --  29 27  --   GLUCOSE  --  104* 110*  --   BUN  --  11 9  --   CALCIUM  --  8.8* 9.1  --   CREATININE 0.90 0.95 0.79 1.10*  GFRNONAA  --  >60 >60  --   GFRAA  --  >60 >60  --     LIVER FUNCTION TESTS: Recent Labs    07/11/17 0539  BILITOT 0.7  AST 23  ALT 15  ALKPHOS 102  PROT 8.3*  ALBUMIN 4.3    TUMOR MARKERS: No results for input(s): AFPTM, CEA, CA199, CHROMGRNA in the last 8760 hours.  Assessment and Plan:  47 year old female with history of uterine fibroids, menorrhagia and dysmenorrhea. Patient has had a very good outcome from the uterine artery embolization procedure. The menstrual bleeding is much lighter and the abdominal cramping has resolved. Overall, the patient is very happy with the procedure.  MRI also confirms that the uterine fibroids were successfully treated and the overall uterine volume has decreased in size. Patient understands that she still has a large uterus but I anticipate that the uterus and fibroids will continue to get smaller over time. We also discussed the possibility of developing new fibroids but she is probably low risk based on her age. Patient will continue her regular follow-ups with her gynecologist and her primary care physician. I encouraged her to be diligent about her blood pressure control and focus on preventative health care such as eating well and managing her weight. She will follow-up with interventional radiology as needed.  Thank you for this interesting consult.  I greatly enjoyed meeting Murel Wigle and look forward to participating in their care.  A copy of this report was sent to the requesting provider on this date.  Electronically Signed: Burman Riis 01/13/2018, 9:21 AM   I spent a total of    15 Minutes in face to face in clinical consultation, greater than 50% of which was counseling/coordinating care for uterine fibroids.   Patient ID: Ladashia Demarinis, female   DOB: 16-Jul-1971, 47 y.o.   MRN: [id number]

## 2018-02-19 ENCOUNTER — Other Ambulatory Visit: Payer: Self-pay | Admitting: Obstetrics and Gynecology

## 2018-02-19 DIAGNOSIS — Z139 Encounter for screening, unspecified: Secondary | ICD-10-CM

## 2018-03-05 DIAGNOSIS — N390 Urinary tract infection, site not specified: Secondary | ICD-10-CM | POA: Diagnosis not present

## 2018-03-05 DIAGNOSIS — Z0000 Encounter for general adult medical examination without abnormal findings: Secondary | ICD-10-CM | POA: Diagnosis not present

## 2018-03-05 DIAGNOSIS — Z13220 Encounter for screening for lipoid disorders: Secondary | ICD-10-CM | POA: Diagnosis not present

## 2018-03-12 ENCOUNTER — Ambulatory Visit
Admission: RE | Admit: 2018-03-12 | Discharge: 2018-03-12 | Disposition: A | Discharge status: Home / Self Care | Payer: 59 | Source: Ambulatory Visit | Attending: Obstetrics and Gynecology | Admitting: Obstetrics and Gynecology

## 2018-03-12 DIAGNOSIS — Z139 Encounter for screening, unspecified: Secondary | ICD-10-CM

## 2018-03-12 DIAGNOSIS — Z1231 Encounter for screening mammogram for malignant neoplasm of breast: Secondary | ICD-10-CM | POA: Diagnosis not present

## 2018-03-27 DIAGNOSIS — R102 Pelvic and perineal pain: Secondary | ICD-10-CM | POA: Diagnosis not present

## 2018-03-27 DIAGNOSIS — Z01419 Encounter for gynecological examination (general) (routine) without abnormal findings: Secondary | ICD-10-CM | POA: Diagnosis not present

## 2018-03-27 DIAGNOSIS — Z124 Encounter for screening for malignant neoplasm of cervix: Secondary | ICD-10-CM | POA: Diagnosis not present

## 2018-08-24 DIAGNOSIS — R399 Unspecified symptoms and signs involving the genitourinary system: Secondary | ICD-10-CM | POA: Diagnosis not present

## 2018-08-24 DIAGNOSIS — S76011A Strain of muscle, fascia and tendon of right hip, initial encounter: Secondary | ICD-10-CM | POA: Diagnosis not present

## 2018-08-24 DIAGNOSIS — Z23 Encounter for immunization: Secondary | ICD-10-CM | POA: Diagnosis not present

## 2018-11-30 DIAGNOSIS — L293 Anogenital pruritus, unspecified: Secondary | ICD-10-CM | POA: Diagnosis not present

## 2018-11-30 DIAGNOSIS — N898 Other specified noninflammatory disorders of vagina: Secondary | ICD-10-CM | POA: Diagnosis not present

## 2019-05-18 ENCOUNTER — Other Ambulatory Visit: Payer: Self-pay | Admitting: Family Medicine

## 2019-05-18 DIAGNOSIS — Z1231 Encounter for screening mammogram for malignant neoplasm of breast: Secondary | ICD-10-CM

## 2019-06-11 ENCOUNTER — Other Ambulatory Visit: Payer: Self-pay | Admitting: Family Medicine

## 2019-06-11 DIAGNOSIS — M79651 Pain in right thigh: Secondary | ICD-10-CM

## 2019-06-29 ENCOUNTER — Ambulatory Visit
Admission: RE | Admit: 2019-06-29 | Discharge: 2019-06-29 | Disposition: A | Discharge status: Home / Self Care | Payer: 59 | Source: Ambulatory Visit | Attending: Family Medicine | Admitting: Family Medicine

## 2019-06-29 ENCOUNTER — Other Ambulatory Visit: Payer: Self-pay

## 2019-06-29 DIAGNOSIS — Z1231 Encounter for screening mammogram for malignant neoplasm of breast: Secondary | ICD-10-CM

## 2021-09-20 ENCOUNTER — Other Ambulatory Visit: Payer: Self-pay | Admitting: Family Medicine

## 2021-09-20 DIAGNOSIS — Z1231 Encounter for screening mammogram for malignant neoplasm of breast: Secondary | ICD-10-CM

## 2021-10-22 ENCOUNTER — Other Ambulatory Visit: Payer: Self-pay

## 2021-10-22 ENCOUNTER — Ambulatory Visit
Admission: RE | Admit: 2021-10-22 | Discharge: 2021-10-22 | Disposition: A | Discharge status: Home / Self Care | Payer: 59 | Source: Ambulatory Visit | Attending: Family Medicine | Admitting: Family Medicine

## 2021-10-22 DIAGNOSIS — Z1231 Encounter for screening mammogram for malignant neoplasm of breast: Secondary | ICD-10-CM

## 2022-08-17 IMAGING — MG MM DIGITAL SCREENING BILAT W/ TOMO AND CAD
8 series · 8 of 24 positions shown · non-contrast
Comparison: Previous exam(s).

CLINICAL DATA: Screening.

EXAM:
DIGITAL SCREENING BILATERAL MAMMOGRAM WITH TOMOSYNTHESIS AND CAD
TECHNIQUE: Bilateral screening digital craniocaudal and mediolateral oblique
mammograms were obtained. Bilateral screening digital breast
tomosynthesis was performed. The images were evaluated with
computer-aided detection.

[R CC synth-2D]
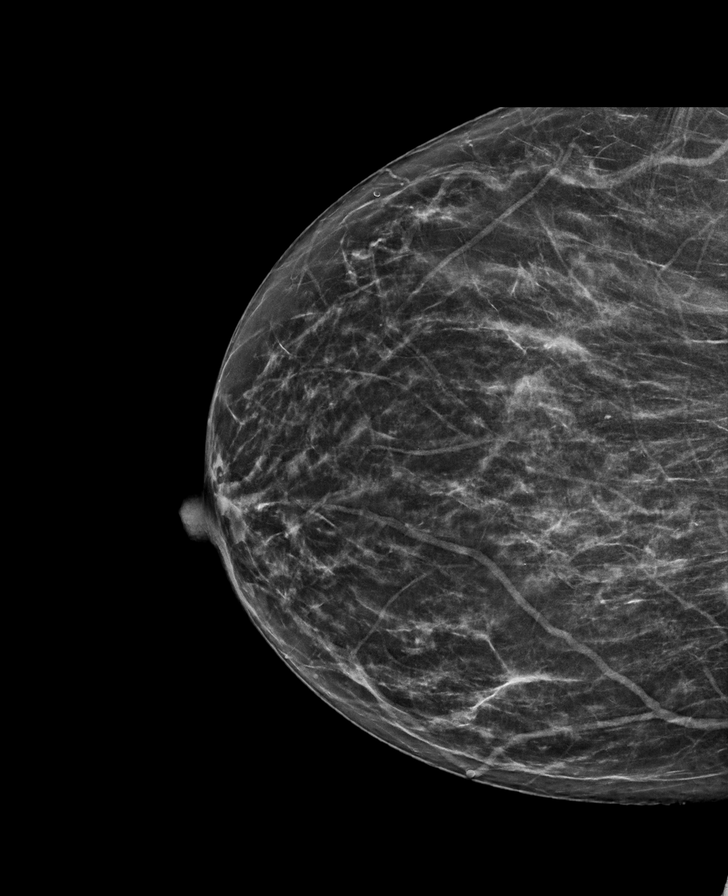

[L MLO synth-2D]
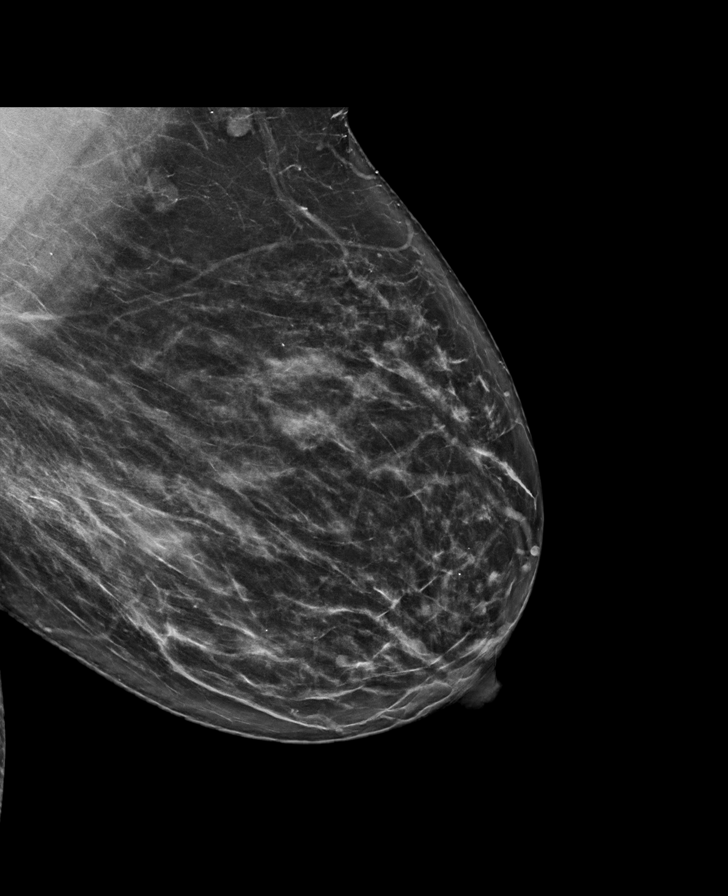

[R MLO synth-2D]
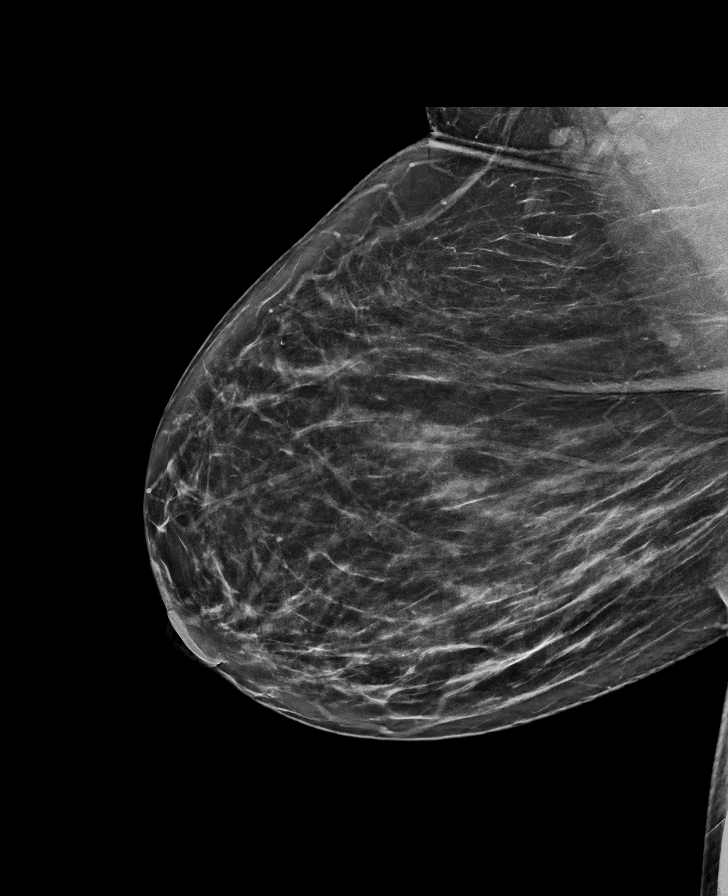

[L CC synth-2D]
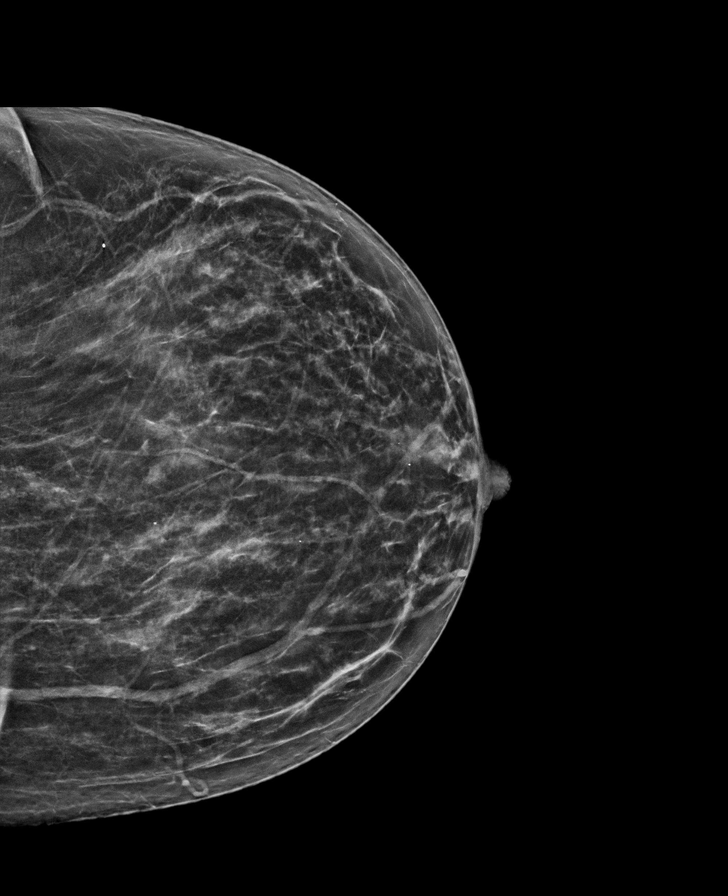

[R CC tomo · tomo slice 32/63.0]
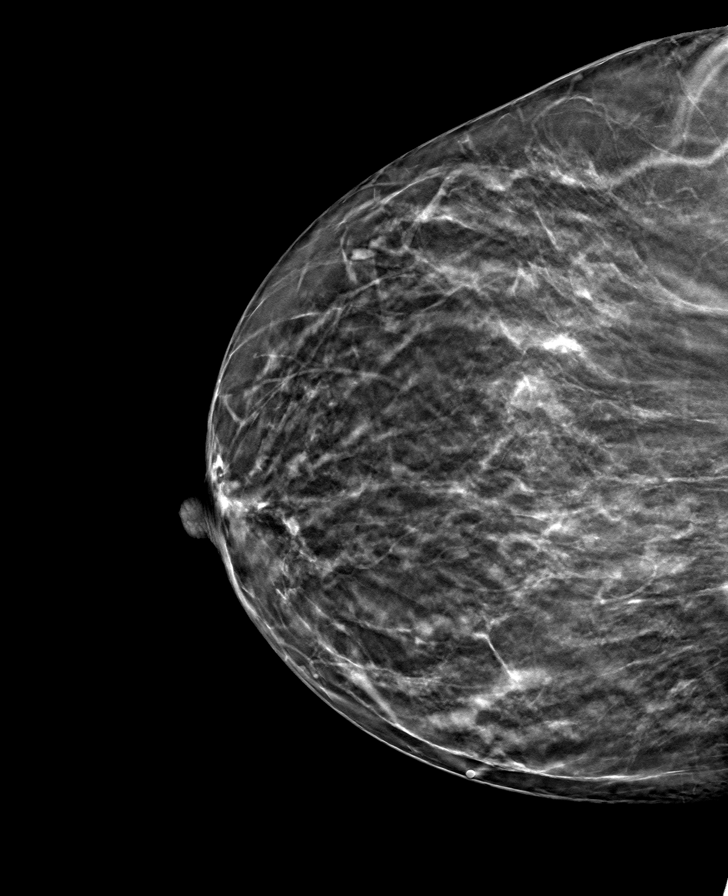

[R MLO tomo · tomo slice 39/78.0]
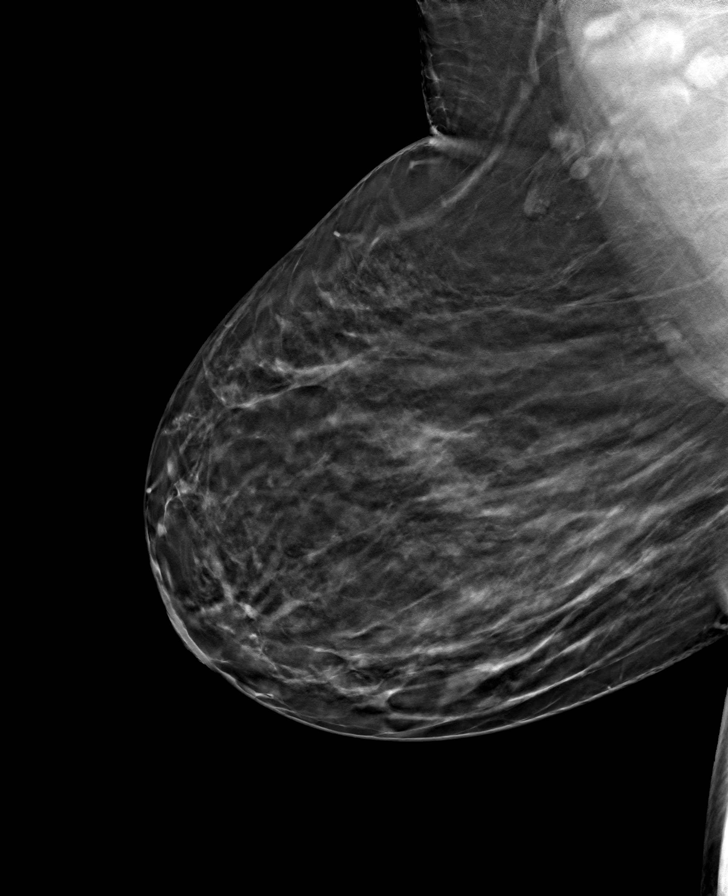

[L MLO tomo · tomo slice 39/78.0]
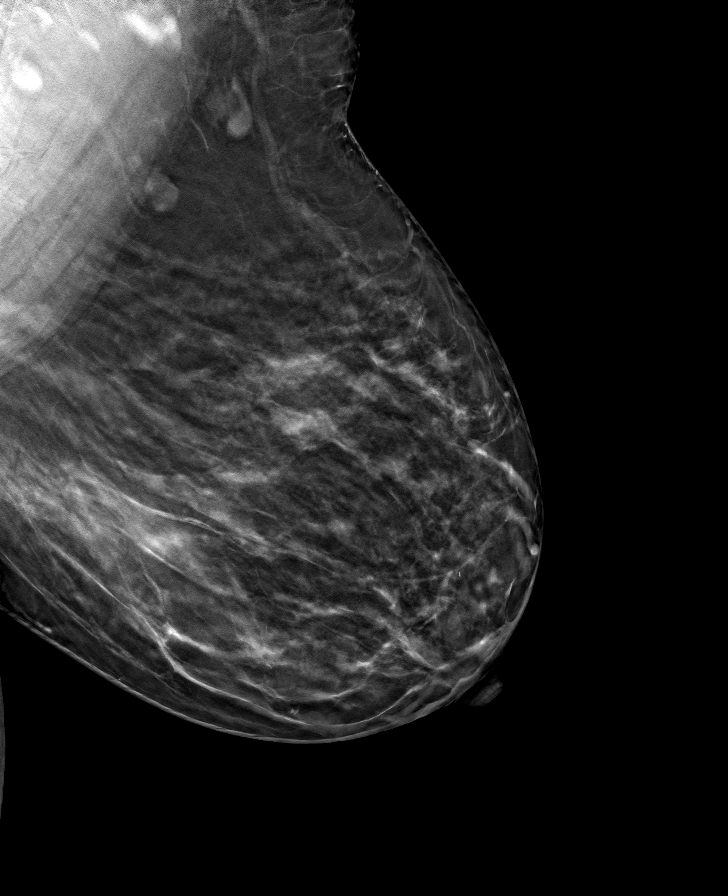

[L CC tomo · tomo slice 31/62.0]
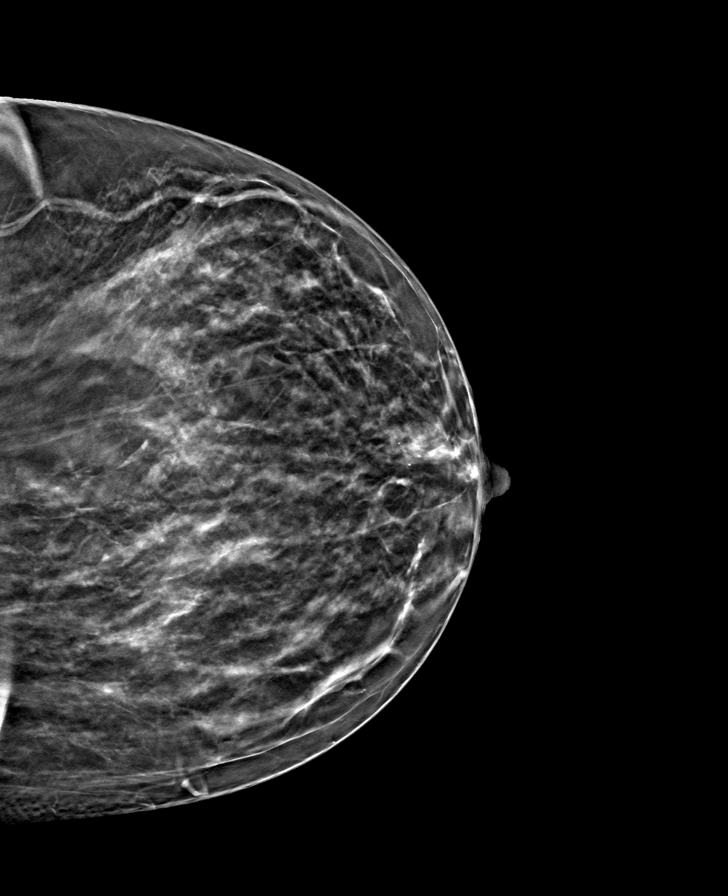

[8 of 24 positions shown; findings below may reference images not displayed]

ACR Breast Density Category c: The breast tissue is heterogeneously
dense, which may obscure small masses.
FINDINGS: There are no findings suspicious for malignancy.
IMPRESSION: No mammographic evidence of malignancy. A result letter of this
screening mammogram will be mailed directly to the patient.

RECOMMENDATION:
Screening mammogram in one year. (Code:Q3-W-BC3)

BI-RADS CATEGORY  1: Negative.

## 2022-10-16 ENCOUNTER — Other Ambulatory Visit: Payer: Self-pay | Admitting: Family Medicine

## 2022-10-16 DIAGNOSIS — Z1231 Encounter for screening mammogram for malignant neoplasm of breast: Secondary | ICD-10-CM

## 2022-11-14 ENCOUNTER — Ambulatory Visit
Admission: RE | Admit: 2022-11-14 | Discharge: 2022-11-14 | Disposition: A | Discharge status: Home / Self Care | Payer: 59 | Source: Ambulatory Visit | Attending: Family Medicine | Admitting: Family Medicine

## 2022-11-14 DIAGNOSIS — Z1231 Encounter for screening mammogram for malignant neoplasm of breast: Secondary | ICD-10-CM

## 2023-08-05 ENCOUNTER — Other Ambulatory Visit: Payer: Self-pay | Admitting: Family Medicine

## 2023-08-05 DIAGNOSIS — R7989 Other specified abnormal findings of blood chemistry: Secondary | ICD-10-CM

## 2023-08-11 ENCOUNTER — Ambulatory Visit
Admission: RE | Admit: 2023-08-11 | Discharge: 2023-08-11 | Disposition: A | Discharge status: Home / Self Care | Payer: 59 | Source: Ambulatory Visit | Attending: Family Medicine | Admitting: Family Medicine

## 2023-08-11 DIAGNOSIS — R7989 Other specified abnormal findings of blood chemistry: Secondary | ICD-10-CM

## 2023-10-22 ENCOUNTER — Other Ambulatory Visit: Payer: Self-pay | Admitting: Family Medicine

## 2023-10-22 DIAGNOSIS — Z1231 Encounter for screening mammogram for malignant neoplasm of breast: Secondary | ICD-10-CM

## 2023-11-18 ENCOUNTER — Ambulatory Visit
Admission: RE | Admit: 2023-11-18 | Discharge: 2023-11-18 | Disposition: A | Discharge status: Home / Self Care | Payer: 59 | Source: Ambulatory Visit | Attending: Family Medicine | Admitting: Family Medicine

## 2023-11-18 DIAGNOSIS — Z1231 Encounter for screening mammogram for malignant neoplasm of breast: Secondary | ICD-10-CM

## 2024-07-30 ENCOUNTER — Ambulatory Visit: Admitting: Student in an Organized Health Care Education/Training Program

## 2024-10-26 ENCOUNTER — Other Ambulatory Visit: Payer: Self-pay | Admitting: Family Medicine

## 2024-10-26 DIAGNOSIS — Z1231 Encounter for screening mammogram for malignant neoplasm of breast: Secondary | ICD-10-CM

## 2024-11-18 ENCOUNTER — Ambulatory Visit
Admission: RE | Admit: 2024-11-18 | Discharge: 2024-11-18 | Disposition: A | Discharge status: Home / Self Care | Source: Ambulatory Visit | Attending: Family Medicine | Admitting: Family Medicine

## 2024-11-18 DIAGNOSIS — Z1231 Encounter for screening mammogram for malignant neoplasm of breast: Secondary | ICD-10-CM
